# Patient Record
Sex: Male | Born: 1935
Health system: Southern US, Community
[De-identification: ages and names within clinical notes are randomized; demographics above are authoritative.]

## PROBLEM LIST (undated history)

## (undated) DIAGNOSIS — I10 Essential (primary) hypertension: Secondary | ICD-10-CM

## (undated) DIAGNOSIS — F329 Major depressive disorder, single episode, unspecified: Secondary | ICD-10-CM

## (undated) DIAGNOSIS — F411 Generalized anxiety disorder: Secondary | ICD-10-CM

## (undated) DIAGNOSIS — Z8546 Personal history of malignant neoplasm of prostate: Secondary | ICD-10-CM

## (undated) DIAGNOSIS — E785 Hyperlipidemia, unspecified: Secondary | ICD-10-CM

## (undated) DIAGNOSIS — N529 Male erectile dysfunction, unspecified: Secondary | ICD-10-CM

## (undated) DIAGNOSIS — J449 Chronic obstructive pulmonary disease, unspecified: Secondary | ICD-10-CM

## (undated) DIAGNOSIS — Z86010 Personal history of colonic polyps: Secondary | ICD-10-CM

## (undated) DIAGNOSIS — R5383 Other fatigue: Secondary | ICD-10-CM

## (undated) DIAGNOSIS — N400 Enlarged prostate without lower urinary tract symptoms: Secondary | ICD-10-CM

## (undated) DIAGNOSIS — G4700 Insomnia, unspecified: Secondary | ICD-10-CM

## (undated) DIAGNOSIS — R5381 Other malaise: Secondary | ICD-10-CM

## (undated) HISTORY — DX: Major depressive disorder, single episode, unspecified: F32.9

## (undated) HISTORY — DX: Benign prostatic hyperplasia without lower urinary tract symptoms: N40.0

## (undated) HISTORY — DX: Other fatigue: R53.83

## (undated) HISTORY — DX: Personal history of malignant neoplasm of prostate: Z85.46

## (undated) HISTORY — DX: Insomnia, unspecified: G47.00

## (undated) HISTORY — DX: Chronic obstructive pulmonary disease, unspecified: J44.9

## (undated) HISTORY — DX: Personal history of colonic polyps: Z86.010

## (undated) HISTORY — DX: Other malaise: R53.81

## (undated) HISTORY — DX: Essential (primary) hypertension: I10

## (undated) HISTORY — DX: Hyperlipidemia, unspecified: E78.5

## (undated) HISTORY — DX: Generalized anxiety disorder: F41.1

## (undated) HISTORY — DX: Male erectile dysfunction, unspecified: N52.9

---

## 2001-12-14 ENCOUNTER — Emergency Department (HOSPITAL_COMMUNITY): Admission: EM | Admit: 2001-12-14 | Discharge: 2001-12-14 | Payer: Self-pay | Admitting: Emergency Medicine

## 2001-12-24 ENCOUNTER — Emergency Department (HOSPITAL_COMMUNITY): Admission: EM | Admit: 2001-12-24 | Discharge: 2001-12-24 | Payer: Self-pay | Admitting: Emergency Medicine

## 2003-04-28 ENCOUNTER — Ambulatory Visit: Admission: RE | Admit: 2003-04-28 | Discharge: 2003-07-27 | Payer: Self-pay | Admitting: Radiation Oncology

## 2003-05-06 ENCOUNTER — Encounter: Admission: RE | Admit: 2003-05-06 | Discharge: 2003-05-06 | Payer: Self-pay | Admitting: Urology

## 2003-06-03 ENCOUNTER — Inpatient Hospital Stay (HOSPITAL_COMMUNITY): Admission: EM | Admit: 2003-06-03 | Discharge: 2003-06-11 | Payer: Self-pay | Admitting: Psychiatry

## 2003-08-13 ENCOUNTER — Ambulatory Visit: Admission: RE | Admit: 2003-08-13 | Discharge: 2003-10-15 | Payer: Self-pay | Admitting: Radiation Oncology

## 2003-08-24 ENCOUNTER — Ambulatory Visit (HOSPITAL_COMMUNITY): Admission: RE | Admit: 2003-08-24 | Discharge: 2003-08-24 | Payer: Self-pay | Admitting: Urology

## 2003-08-24 ENCOUNTER — Ambulatory Visit (HOSPITAL_BASED_OUTPATIENT_CLINIC_OR_DEPARTMENT_OTHER): Admission: RE | Admit: 2003-08-24 | Discharge: 2003-08-24 | Payer: Self-pay | Admitting: Urology

## 2004-07-31 ENCOUNTER — Ambulatory Visit: Payer: Self-pay | Admitting: Internal Medicine

## 2004-10-31 ENCOUNTER — Ambulatory Visit: Payer: Self-pay | Admitting: Internal Medicine

## 2004-11-06 ENCOUNTER — Ambulatory Visit (HOSPITAL_COMMUNITY): Admission: RE | Admit: 2004-11-06 | Discharge: 2004-11-06 | Payer: Self-pay | Admitting: Internal Medicine

## 2005-03-02 ENCOUNTER — Ambulatory Visit: Payer: Self-pay | Admitting: Internal Medicine

## 2006-07-05 ENCOUNTER — Ambulatory Visit: Payer: Self-pay | Admitting: Internal Medicine

## 2006-07-05 LAB — CONVERTED CEMR LAB
ALT: 21 units/L (ref 0–40)
AST: 20 units/L (ref 0–37)
Albumin: 3.6 g/dL (ref 3.5–5.2)
Alkaline Phosphatase: 76 units/L (ref 39–117)
BUN: 8 mg/dL (ref 6–23)
Basophils Absolute: 0 10*3/uL (ref 0.0–0.1)
Basophils Relative: 0 % (ref 0.0–1.0)
Bilirubin, Direct: 0.1 mg/dL (ref 0.0–0.3)
CO2: 26 meq/L (ref 19–32)
Calcium: 9.1 mg/dL (ref 8.4–10.5)
Chloride: 101 meq/L (ref 96–112)
Creatinine, Ser: 1.2 mg/dL (ref 0.4–1.5)
Eosinophils Absolute: 0.1 10*3/uL (ref 0.0–0.6)
Eosinophils Relative: 0.8 % (ref 0.0–5.0)
GFR calc Af Amer: 77 mL/min
GFR calc non Af Amer: 64 mL/min
Glucose, Bld: 100 mg/dL — ABNORMAL HIGH (ref 70–99)
HCT: 51.4 % (ref 39.0–52.0)
Hemoglobin: 17.7 g/dL — ABNORMAL HIGH (ref 13.0–17.0)
Lymphocytes Relative: 18.1 % (ref 12.0–46.0)
MCHC: 34.4 g/dL (ref 30.0–36.0)
MCV: 93.5 fL (ref 78.0–100.0)
Monocytes Absolute: 0.8 10*3/uL — ABNORMAL HIGH (ref 0.2–0.7)
Monocytes Relative: 8.6 % (ref 3.0–11.0)
Neutro Abs: 6.3 10*3/uL (ref 1.4–7.7)
Neutrophils Relative %: 72.5 % (ref 43.0–77.0)
Platelets: 218 10*3/uL (ref 150–400)
Potassium: 4.9 meq/L (ref 3.5–5.1)
RBC: 5.5 M/uL (ref 4.22–5.81)
RDW: 12.5 % (ref 11.5–14.6)
Sodium: 137 meq/L (ref 135–145)
TSH: 2.1 microintl units/mL (ref 0.35–5.50)
Total Bilirubin: 1.2 mg/dL (ref 0.3–1.2)
Total Protein: 6.8 g/dL (ref 6.0–8.3)
WBC: 8.8 10*3/uL (ref 4.5–10.5)

## 2006-10-17 ENCOUNTER — Ambulatory Visit (HOSPITAL_COMMUNITY): Admission: RE | Admit: 2006-10-17 | Discharge: 2006-10-17 | Payer: Self-pay | Admitting: Urology

## 2007-12-22 ENCOUNTER — Ambulatory Visit: Payer: Self-pay | Admitting: Internal Medicine

## 2007-12-22 DIAGNOSIS — E785 Hyperlipidemia, unspecified: Secondary | ICD-10-CM

## 2007-12-22 DIAGNOSIS — F329 Major depressive disorder, single episode, unspecified: Secondary | ICD-10-CM

## 2007-12-22 DIAGNOSIS — G4700 Insomnia, unspecified: Secondary | ICD-10-CM

## 2007-12-22 DIAGNOSIS — F411 Generalized anxiety disorder: Secondary | ICD-10-CM

## 2007-12-22 DIAGNOSIS — N400 Enlarged prostate without lower urinary tract symptoms: Secondary | ICD-10-CM

## 2007-12-22 DIAGNOSIS — Z8546 Personal history of malignant neoplasm of prostate: Secondary | ICD-10-CM

## 2007-12-22 DIAGNOSIS — J449 Chronic obstructive pulmonary disease, unspecified: Secondary | ICD-10-CM

## 2007-12-22 DIAGNOSIS — R5383 Other fatigue: Secondary | ICD-10-CM

## 2007-12-22 DIAGNOSIS — Z860100 Personal history of colon polyps, unspecified: Secondary | ICD-10-CM | POA: Insufficient documentation

## 2007-12-22 DIAGNOSIS — R5381 Other malaise: Secondary | ICD-10-CM

## 2007-12-22 DIAGNOSIS — I10 Essential (primary) hypertension: Secondary | ICD-10-CM

## 2007-12-22 HISTORY — DX: Benign prostatic hyperplasia without lower urinary tract symptoms: N40.0

## 2007-12-22 HISTORY — DX: Hyperlipidemia, unspecified: E78.5

## 2007-12-22 HISTORY — DX: Major depressive disorder, single episode, unspecified: F32.9

## 2007-12-22 HISTORY — DX: Chronic obstructive pulmonary disease, unspecified: J44.9

## 2007-12-22 HISTORY — DX: Insomnia, unspecified: G47.00

## 2007-12-22 HISTORY — DX: Essential (primary) hypertension: I10

## 2007-12-22 HISTORY — DX: Personal history of malignant neoplasm of prostate: Z85.46

## 2007-12-22 HISTORY — DX: Personal history of colonic polyps: Z86.010

## 2007-12-22 HISTORY — DX: Generalized anxiety disorder: F41.1

## 2007-12-22 HISTORY — DX: Other malaise: R53.81

## 2007-12-22 LAB — CONVERTED CEMR LAB
ALT: 13 units/L (ref 0–53)
AST: 16 units/L (ref 0–37)
Albumin: 3.6 g/dL (ref 3.5–5.2)
Alkaline Phosphatase: 75 units/L (ref 39–117)
BUN: 13 mg/dL (ref 6–23)
Basophils Absolute: 0 10*3/uL (ref 0.0–0.1)
Basophils Relative: 0 % (ref 0.0–3.0)
Bilirubin, Direct: 0.1 mg/dL (ref 0.0–0.3)
CO2: 29 meq/L (ref 19–32)
Calcium: 8.9 mg/dL (ref 8.4–10.5)
Chloride: 106 meq/L (ref 96–112)
Cholesterol: 201 mg/dL (ref 0–200)
Creatinine, Ser: 1.3 mg/dL (ref 0.4–1.5)
Direct LDL: 118.1 mg/dL
Eosinophils Absolute: 0.1 10*3/uL (ref 0.0–0.7)
Eosinophils Relative: 0.6 % (ref 0.0–5.0)
GFR calc Af Amer: 70 mL/min
GFR calc non Af Amer: 58 mL/min
Glucose, Bld: 96 mg/dL (ref 70–99)
HCT: 46.1 % (ref 39.0–52.0)
HDL: 52.2 mg/dL (ref >39.0)
Hemoglobin: 15.4 g/dL (ref 13.0–17.0)
Lymphocytes Relative: 18.9 % (ref 12.0–46.0)
MCHC: 33.5 g/dL (ref 30.0–36.0)
MCV: 95.5 fL (ref 78.0–100.0)
Monocytes Absolute: 0.7 10*3/uL (ref 0.1–1.0)
Monocytes Relative: 8.4 % (ref 3.0–12.0)
Neutro Abs: 6 10*3/uL (ref 1.4–7.7)
Neutrophils Relative %: 72.1 % (ref 43.0–77.0)
Platelets: 232 10*3/uL (ref 150–400)
Potassium: 4.8 meq/L (ref 3.5–5.1)
RBC: 4.83 M/uL (ref 4.22–5.81)
RDW: 13.2 % (ref 11.5–14.6)
Sodium: 141 meq/L (ref 135–145)
TSH: 1.84 microintl units/mL (ref 0.35–5.50)
Total Bilirubin: 0.6 mg/dL (ref 0.3–1.2)
Total CHOL/HDL Ratio: 3.9
Total Protein: 6.8 g/dL (ref 6.0–8.3)
Triglycerides: 113 mg/dL (ref 0–149)
VLDL: 23 mg/dL (ref 0–40)
WBC: 8.4 10*3/uL (ref 4.5–10.5)

## 2007-12-24 LAB — CONVERTED CEMR LAB: Vit D, 1,25-Dihydroxy: 20 — ABNORMAL LOW (ref 30–89)

## 2008-01-22 ENCOUNTER — Ambulatory Visit: Payer: Self-pay | Admitting: Internal Medicine

## 2008-02-05 ENCOUNTER — Ambulatory Visit: Payer: Self-pay | Admitting: Internal Medicine

## 2008-02-05 ENCOUNTER — Encounter: Payer: Self-pay | Admitting: Internal Medicine

## 2008-02-09 ENCOUNTER — Encounter: Payer: Self-pay | Admitting: Internal Medicine

## 2008-03-02 ENCOUNTER — Ambulatory Visit: Payer: Self-pay | Admitting: Internal Medicine

## 2008-04-06 ENCOUNTER — Telehealth (INDEPENDENT_AMBULATORY_CARE_PROVIDER_SITE_OTHER): Payer: Self-pay | Admitting: *Deleted

## 2008-06-25 ENCOUNTER — Ambulatory Visit: Payer: Self-pay | Admitting: Internal Medicine

## 2008-06-25 ENCOUNTER — Telehealth (INDEPENDENT_AMBULATORY_CARE_PROVIDER_SITE_OTHER): Payer: Self-pay | Admitting: *Deleted

## 2008-09-22 ENCOUNTER — Telehealth (INDEPENDENT_AMBULATORY_CARE_PROVIDER_SITE_OTHER): Payer: Self-pay | Admitting: *Deleted

## 2008-12-07 ENCOUNTER — Telehealth: Payer: Self-pay | Admitting: Internal Medicine

## 2009-02-08 ENCOUNTER — Telehealth: Payer: Self-pay | Admitting: Internal Medicine

## 2009-02-21 ENCOUNTER — Telehealth: Payer: Self-pay | Admitting: Internal Medicine

## 2009-02-25 ENCOUNTER — Ambulatory Visit: Payer: Self-pay | Admitting: Internal Medicine

## 2009-02-25 LAB — CONVERTED CEMR LAB
ALT: 12 units/L (ref 0–53)
AST: 19 units/L (ref 0–37)
Albumin: 3.8 g/dL (ref 3.5–5.2)
Alkaline Phosphatase: 81 units/L (ref 39–117)
BUN: 16 mg/dL (ref 6–23)
Basophils Absolute: 0 10*3/uL (ref 0.0–0.1)
Basophils Relative: 0.4 % (ref 0.0–3.0)
Bilirubin, Direct: 0.1 mg/dL (ref 0.0–0.3)
CO2: 27 meq/L (ref 19–32)
Calcium: 8.7 mg/dL (ref 8.4–10.5)
Chloride: 104 meq/L (ref 96–112)
Cholesterol: 202 mg/dL — ABNORMAL HIGH (ref 0–200)
Creatinine, Ser: 1.3 mg/dL (ref 0.4–1.5)
Direct LDL: 132.3 mg/dL
Eosinophils Absolute: 0.1 10*3/uL (ref 0.0–0.7)
Eosinophils Relative: 1.2 % (ref 0.0–5.0)
GFR calc non Af Amer: 57.52 mL/min (ref >60)
Glucose, Bld: 115 mg/dL — ABNORMAL HIGH (ref 70–99)
HCT: 46.9 % (ref 39.0–52.0)
HDL: 60.5 mg/dL (ref >39.00)
Hemoglobin: 16.1 g/dL (ref 13.0–17.0)
Leukocytes, UA: NEGATIVE
Lymphocytes Relative: 16.7 % (ref 12.0–46.0)
Lymphs Abs: 1.3 10*3/uL (ref 0.7–4.0)
MCHC: 34.4 g/dL (ref 30.0–36.0)
MCV: 96.4 fL (ref 78.0–100.0)
Monocytes Absolute: 0.7 10*3/uL (ref 0.1–1.0)
Monocytes Relative: 8.9 % (ref 3.0–12.0)
Neutro Abs: 5.6 10*3/uL (ref 1.4–7.7)
Neutrophils Relative %: 72.8 % (ref 43.0–77.0)
Nitrite: NEGATIVE
Platelets: 231 10*3/uL (ref 150.0–400.0)
Potassium: 5.2 meq/L — ABNORMAL HIGH (ref 3.5–5.1)
RBC: 4.87 M/uL (ref 4.22–5.81)
RDW: 13.7 % (ref 11.5–14.6)
Sodium: 135 meq/L (ref 135–145)
Specific Gravity, Urine: 1.025 (ref 1.000–1.030)
TSH: 1.84 microintl units/mL (ref 0.35–5.50)
Total Bilirubin: 0.8 mg/dL (ref 0.3–1.2)
Total CHOL/HDL Ratio: 3
Total Protein, Urine: NEGATIVE mg/dL
Total Protein: 7.1 g/dL (ref 6.0–8.3)
Triglycerides: 74 mg/dL (ref 0.0–149.0)
Urine Glucose: NEGATIVE mg/dL
Urobilinogen, UA: 1 (ref 0.0–1.0)
VLDL: 14.8 mg/dL (ref 0.0–40.0)
WBC: 7.7 10*3/uL (ref 4.5–10.5)
pH: 5.5 (ref 5.0–8.0)

## 2009-03-07 ENCOUNTER — Telehealth: Payer: Self-pay | Admitting: Internal Medicine

## 2009-03-11 ENCOUNTER — Ambulatory Visit: Payer: Self-pay | Admitting: Internal Medicine

## 2009-04-12 ENCOUNTER — Ambulatory Visit: Payer: Self-pay | Admitting: Pulmonary Disease

## 2009-04-12 ENCOUNTER — Encounter: Payer: Self-pay | Admitting: Internal Medicine

## 2009-06-12 ENCOUNTER — Encounter: Payer: Self-pay | Admitting: Emergency Medicine

## 2009-06-12 ENCOUNTER — Inpatient Hospital Stay (HOSPITAL_COMMUNITY): Admission: AD | Admit: 2009-06-12 | Discharge: 2009-06-18 | Payer: Self-pay | Admitting: Orthopedic Surgery

## 2009-06-14 ENCOUNTER — Encounter (INDEPENDENT_AMBULATORY_CARE_PROVIDER_SITE_OTHER): Payer: Self-pay | Admitting: Orthopedic Surgery

## 2010-02-20 ENCOUNTER — Ambulatory Visit: Payer: Self-pay | Admitting: Internal Medicine

## 2010-02-20 LAB — CONVERTED CEMR LAB
ALT: 13 units/L (ref 0–53)
AST: 20 units/L (ref 0–37)
Albumin: 3.8 g/dL (ref 3.5–5.2)
Alkaline Phosphatase: 65 units/L (ref 39–117)
BUN: 18 mg/dL (ref 6–23)
Basophils Absolute: 0.1 10*3/uL (ref 0.0–0.1)
Basophils Relative: 0.9 % (ref 0.0–3.0)
Bilirubin, Direct: 0.1 mg/dL (ref 0.0–0.3)
CO2: 29 meq/L (ref 19–32)
Calcium: 9 mg/dL (ref 8.4–10.5)
Chloride: 104 meq/L (ref 96–112)
Cholesterol: 204 mg/dL — ABNORMAL HIGH (ref 0–200)
Creatinine, Ser: 1.1 mg/dL (ref 0.4–1.5)
Direct LDL: 108.9 mg/dL
Eosinophils Absolute: 0.1 10*3/uL (ref 0.0–0.7)
Eosinophils Relative: 1.5 % (ref 0.0–5.0)
GFR calc non Af Amer: 68.13 mL/min (ref >60)
Glucose, Bld: 91 mg/dL (ref 70–99)
HCT: 47.3 % (ref 39.0–52.0)
HDL: 65.8 mg/dL (ref >39.00)
Hemoglobin, Urine: NEGATIVE
Hemoglobin: 16.2 g/dL (ref 13.0–17.0)
Leukocytes, UA: NEGATIVE
Lymphocytes Relative: 24.8 % (ref 12.0–46.0)
Lymphs Abs: 1.9 10*3/uL (ref 0.7–4.0)
MCHC: 34.2 g/dL (ref 30.0–36.0)
MCV: 97.8 fL (ref 78.0–100.0)
Monocytes Absolute: 0.5 10*3/uL (ref 0.1–1.0)
Monocytes Relative: 6.8 % (ref 3.0–12.0)
Neutro Abs: 5 10*3/uL (ref 1.4–7.7)
Neutrophils Relative %: 66 % (ref 43.0–77.0)
Nitrite: NEGATIVE
PSA: 0.3 ng/mL (ref 0.10–4.00)
Platelets: 211 10*3/uL (ref 150.0–400.0)
Potassium: 4.9 meq/L (ref 3.5–5.1)
RBC: 4.84 M/uL (ref 4.22–5.81)
RDW: 13.7 % (ref 11.5–14.6)
Sodium: 139 meq/L (ref 135–145)
Specific Gravity, Urine: >=1.03 (ref 1.000–1.030)
TSH: 1.38 microintl units/mL (ref 0.35–5.50)
Total Bilirubin: 0.6 mg/dL (ref 0.3–1.2)
Total CHOL/HDL Ratio: 3
Total Protein, Urine: NEGATIVE mg/dL
Total Protein: 6.5 g/dL (ref 6.0–8.3)
Triglycerides: 63 mg/dL (ref 0.0–149.0)
Urine Glucose: NEGATIVE mg/dL
Urobilinogen, UA: 0.2 (ref 0.0–1.0)
VLDL: 12.6 mg/dL (ref 0.0–40.0)
WBC: 7.6 10*3/uL (ref 4.5–10.5)
pH: 5 (ref 5.0–8.0)

## 2010-04-10 ENCOUNTER — Telehealth: Payer: Self-pay | Admitting: Internal Medicine

## 2010-04-17 ENCOUNTER — Ambulatory Visit: Payer: Self-pay | Admitting: Pulmonary Disease

## 2010-05-04 ENCOUNTER — Telehealth: Payer: Self-pay | Admitting: Internal Medicine

## 2010-06-27 NOTE — Progress Notes (Signed)
Summary: Rx req for sleep  Phone Note Call from Patient Call back at Home Phone [phone]   Caller: Patient Summary of Call: Pt called requesting Rx for Lunesta for sleep. Pt stated he has tried [place] nd Trazadone with no help, please advise.  [facility] Initial call taken by: Margaret Pyle, CMA,  May 04, 2010 4:59 PM  Follow-up for Phone Call        ok - done hardcopy to LIM side B - dahlia  Follow-up by: Corwin Levins MD,  May 04, 2010 5:03 PM  Additional Follow-up for Phone Call Additional follow up Details #1::        Rx faxed to pharmacy Additional Follow-up by: Margaret Pyle, CMA,  May 05, 2010 8:19 AM    New/Updated Medications: LUNESTA 3 MG TABS (ESZOPICLONE) 1 by mouth at bedtime as needed Prescriptions: LUNESTA 3 MG TABS (ESZOPICLONE) 1 by mouth at bedtime as needed  #30 x 5   Entered and Authorized by:   Corwin Levins MD   Signed by:   Corwin Levins MD on 05/04/2010   Method used:   Print then Give to Patient   RxID:   [id number]

## 2010-06-27 NOTE — Assessment & Plan Note (Signed)
Summary: rov for insomnia   Primary Tim Foster/Referring Della Homan:  Dr. Oliver Barre  CC:  f/u. Pt c/o still having problems going to sleep. d/c sleep medication due to it not helping.  History of Present Illness: the pt comes in today for f/u of his insomnia.  He has lifelong insomnia and was evaluated one year ago for this.  The etiology was felt to be multifactorial, including sleep hygiene, alcohol, depression, and anxiety.  I reviewed sleep hygiene practices with him, and also went over stimulus control therapy.  I tried trazodone for 2 weeks, but the pt states that it did not help.  He comes in today with basically the same issues.  He really did not stick with the behavioral therapies I recommended.  Preventive Screening-Counseling & Management  Alcohol-Tobacco     Alcohol drinks/day: 3     Alcohol type: beer     Smoking Status: current     Packs/Day: <0.25     Pack years: 50 yrs x2 ppd  Current Medications (verified): 1)  None  Allergies (verified): No Known Drug Allergies  Social History: Smoking Status:  current Packs/Day:  <0.25  Review of Systems       The patient complains of productive cough, non-productive cough, and sneezing.  The patient denies shortness of breath with activity, shortness of breath at rest, coughing up blood, chest pain, irregular heartbeats, acid heartburn, indigestion, loss of appetite, weight change, abdominal pain, difficulty swallowing, sore throat, tooth/dental problems, headaches, nasal congestion/difficulty breathing through nose, itching, ear ache, anxiety, depression, hand/feet swelling, joint stiffness or pain, rash, change in color of mucus, and fever.    Vital Signs:  Patient profile:   77 year old male Height:      68 inches Weight:      137 pounds O2 Sat:      97 % on Room air Temp:     97.7 degrees F oral Pulse rate:   124 / minute BP sitting:   100 / 70  (left arm) Cuff size:   regular  Vitals Entered By: Carver Fila (April 17, 2010 2:01 PM)  O2 Flow:  Room air CC: f/u. Pt c/o still having problems going to sleep. d/c sleep medication due to it not helping Comments meds and allergies updated Phone number updated Carver Fila  April 17, 2010 2:02 PM    Physical Exam  General:  thin male in nad Extremities:  no edema or cyanosis  Neurologic:  appears mild sleepy, oriented,  moves all 4.   Impression & Recommendations:  Problem # 1:  PERSISTENT DISORDER INITIATING/MAINTAINING SLEEP (ICD-307.42)  the pt continues to have major issues with his sleep that are deep-seeded.  I have reviewed sleep hygiene with him again, as well as stimulus control therapy, and this is the best that I can do at this point.  I would highly recommend referral to behavioral specialist to work on some of his issues and possibly cognitive behavioral therapy.  Other Orders: Est. Patient Level III (57846)  Patient Instructions: 1)  do not go to bed until 12mn.  If you cannot go to sleep within , need to go to family room and watch tv or read until you are able to go back to sleep.  The goal is to not stay in bed any more than without actually sleeping.  Do this as many times as it takes until you fall asleep. 2)  Get up by 7am each day to take advantage of  morning sunlight, no matter how many hours you slept the night before.   3)  no napping during day ,and no caffeine after 10am. 4)  try and limit alcohol intake to one beverage a day. 5)  I am going to recommend to Dr. Jonny Ruiz to consider a referral to a behavioral therapist to help with this problem.   Immunization History:  Pneumovax Immunization History:    Pneumovax:  historical (09/29/2008)

## 2010-06-27 NOTE — Assessment & Plan Note (Signed)
Summary: SLEEP ISSUES/NWS  #   Vital Signs:  Patient profile:   77 year old male Height:      68 inches Weight:      139.50 pounds BMI:     21.29 O2 Sat:      99 % on Room air Temp:     97.3 degrees F oral Pulse rate:   100 / minute BP sitting:   102 / 80  (left arm) Cuff size:   regular  Vitals Entered By: Zella Ball Ewing CMA Duncan Dull) (February 20, 2010 2:20 PM)  O2 Flow:  Room air  Preventive Care Screening  Colonoscopy:    Date:  02/05/2008    Next Due:  01/2013    Results:  abnormal   CC: Sleep problems/RE   Primary Care Provider:  Dr. Oliver Barre  CC:  Sleep problems/RE.  History of Present Illness: here for wellness with with chronic insomnia for approx 10 mo;  not taking recent meds b/c did not seem to work;  Pt denies CP, worsening sob, doe, wheezing, orthopnea, pnd, worsening LE edema, palps, dizziness or syncope  Pt denies new neuro symptoms such as headache, facial or extremity weakness Pt denies polydipsia, polyuria  Overall good compliance with meds, trying to follow low chol, DM diet, wt stable, little excercise however  Denies worsening depressive symptoms or anxiety or panic,  No suicidal ideation  Preventive Screening-Counseling & Management      Drug Use:  no.    Problems Prior to Update: 1)  Persistent Disorder Initiating/maintaining Sleep  (ICD-307.42) 2)  Preventive Health Care  (ICD-V70.0) 3)  Fatigue  (ICD-780.79) 4)  COPD  (ICD-496) 5)  Benign Prostatic Hypertrophy  (ICD-600.00) 6)  Insomnia, Chronic  (ICD-307.42) 7)  Anxiety  (ICD-300.00) 8)  Depression  (ICD-311) 9)  Prostate Cancer, Hx of  (ICD-V10.46) 10)  Colonic Polyps, Hx of  (ICD-V12.72) 11)  Hypertension  (ICD-401.9) 12)  Hyperlipidemia  (ICD-272.4)  Medications Prior to Update: 1)  Mirtazapine 15 Mg Tabs (Mirtazapine) .Marland Kitchen.. 1po At Bedtime 2)  Vitamin D3 1000 Unit  Tabs (Cholecalciferol) .Marland Kitchen.. 1 By Mouth Once Daily 3)  Trazodone Hcl 100 Mg Tabs (Trazodone Hcl) .Marland Kitchen.. 1 By Mouth At  Bedtime 4)  Trazodone Hcl 50 Mg  Tabs (Trazodone Hcl) .... At Bedtime  Current Medications (verified): 1)  Vitamin D3 1000 Unit  Tabs (Cholecalciferol) .Marland Kitchen.. 1 By Mouth Once Daily 2)  Aspir-Low 81 Mg Tbec (Aspirin) .Marland Kitchen.. 1po Once Daily 3)  Silenor 6 Mg Tabs (Doxepin Hcl) .Marland Kitchen.. 1po At Bedtime As Needed Sleep  Allergies (verified): No Known Drug Allergies  Past History:  Past Surgical History: Last updated: 12/22/2007 Denies surgical history  Family History: Last updated: 12/22/2007 brother with depression, heart disease  Social History: Last updated: 02/20/2010 Married Current Smoker Alcohol use-yes former truck Hospital doctor Drug use-no  Risk Factors: Alcohol Use: 3 (04/12/2009)  Risk Factors: Smoking Status: quit (04/12/2009)  Past Medical History: Hyperlipidemia Hypertension hx of ETOH dependence Colonic polyps, hx of Prostate cancer, hx of - dr Aldean Ast, s/p XRT 12/04 Depression - hospitalized 1/05 Anxiety insomnia - chronic Benign prostatic hypertrophy known bilat inguinal hernias - declined surgury E.D.  hx of gambling addiction COPD  Social History: Reviewed history from 12/22/2007 and no changes required. 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         all otherwise negative per pt -    Physical Exam  General:  alert and underweight appearing.  , chronic ill appearing, mild disheveled, looks older than stated age Head:  normocephalic  and atraumatic.   Eyes:  vision grossly intact, pupils equal, and pupils round.   Ears:  R ear normal and L ear normal.   Nose:  no external deformity and no nasal discharge.   Mouth:  no gingival abnormalities and pharynx pink and moist.   Neck:  supple and no masses.   Lungs:  normal respiratory effort and normal breath sounds.   Heart:  normal rate and regular rhythm.   Abdomen:  soft, non-tender, and normal bowel sounds.   Msk:  no joint tenderness and no joint swelling.   Extremities:  no edema, no erythema  Neurologic:  cranial nerves II-XII intact and strength normal in all extremities.   Skin:  color normal and no rashes.   Psych:  not depressed appearing and slightly anxious.     Impression & Recommendations:  Problem # 1:  Preventive Health Care (ICD-V70.0)  Overall doing well, age appropriate education and counseling updated and referral for appropriate preventive services done unless declined, immunizations up to date or declined, diet counseling done if overweight, urged to quit smoking if smokes , most recent labs reviewed and current ordered if appropriate, ecg reviewed or declined (interpretation per ECG scanned in the EMR if done); information regarding Medicare Prevention requirements given if appropriate; speciality referrals updated as appropriate   Orders: TLB-BMP (Basic Metabolic Panel-BMET) (80048-METABOL) TLB-CBC Platelet - w/Differential (85025-CBCD) TLB-Hepatic/Liver Function Pnl (80076-HEPATIC) TLB-Lipid Panel (80061-LIPID) TLB-TSH (Thyroid Stimulating Hormone) (84443-TSH) TLB-Udip ONLY (81003-UDIP)  Problem # 2:  INSOMNIA, CHRONIC (ICD-307.42) treat as above, f/u any worsening signs or symptoms  - try the silenor - gave samples as well   Problem # 3:  PROSTATE CANCER, HX OF (ICD-V10.46)  pt requests PSA re-eval; I urged also to return to see Dr Aldean Ast  Orders: TLB-PSA (Prostate Specific Antigen) (84153-PSA)  Complete Medication List: 1)  Vitamin  D3 1000 Unit Tabs (Cholecalciferol) .Marland Kitchen.. 1 by mouth once daily 2)  Aspir-low 81 Mg Tbec (Aspirin) .Marland Kitchen.. 1po once daily 3)  Silenor 6 Mg Tabs (Doxepin hcl) .Marland Kitchen.. 1po at bedtime as needed sleep  Other Orders: Flu Vaccine 61yrs + MEDICARE PATIENTS (Y7829) Administration Flu vaccine - MCR (F6213)  Patient Instructions: 1)  you had the flu shot today 2)  Please go to the Lab in the basement for your blood and/or urine tests today 3)  Please take all new medications as prescribed 4)  Please call the number on the [facility] Card for results of your testing  5)  Please schedule a follow-up appointment in 1 year or sooner if needed Prescriptions: SILENOR 6 MG TABS (DOXEPIN HCL) 1po at bedtime as needed sleep  #30 x 11   Entered and Authorized by:   Corwin Levins MD   Signed by:   Corwin Levins MD on 02/20/2010   Method used:   Print then Give to Patient   RxID:   [id number]   Flu Vaccine Consent Questions     Do you have a history of severe allergic reactions to this vaccine? no    Any prior history  of allergic reactions to egg and/or gelatin? no    Do you have a sensitivity to the preservative Thimersol? no    Do you have a past history of Guillan-Barre Syndrome? no    Do you currently have an acute febrile illness? no    Have you ever had a severe reaction to latex? no    Vaccine information given and explained to patient? yes    Are you currently pregnant? no    Lot Number:[id number]   Exp Date:11/25/2010   Site Given  Left Deltoid IMdflu

## 2010-06-27 NOTE — Progress Notes (Signed)
Summary: Referral  Phone Note Call from Patient Call back at Home Phone [phone]   Caller: Patient Summary of Call: Pt called requesting referral to [facility] fro Insomnia. Pt aware that he will be contacted by [facility] once order is placed. Initial call taken by: Margaret Pyle, CMA,  April 10, 2010 1:38 PM  Follow-up for Phone Call        ok - will do Follow-up by: Corwin Levins MD,  April 10, 2010 2:12 PM

## 2010-08-13 LAB — BASIC METABOLIC PANEL
BUN: 12 mg/dL (ref 6–23)
BUN: 12 mg/dL (ref 6–23)
BUN: 13 mg/dL (ref 6–23)
BUN: 17 mg/dL (ref 6–23)
CO2: 22 mEq/L (ref 19–32)
CO2: 22 mEq/L (ref 19–32)
CO2: 24 mEq/L (ref 19–32)
CO2: 24 mEq/L (ref 19–32)
Calcium: 7.5 mg/dL — ABNORMAL LOW (ref 8.4–10.5)
Calcium: 7.7 mg/dL — ABNORMAL LOW (ref 8.4–10.5)
Calcium: 7.8 mg/dL — ABNORMAL LOW (ref 8.4–10.5)
Calcium: 8.3 mg/dL — ABNORMAL LOW (ref 8.4–10.5)
Chloride: 104 mEq/L (ref 96–112)
Chloride: 105 mEq/L (ref 96–112)
Chloride: 105 mEq/L (ref 96–112)
Chloride: 106 mEq/L (ref 96–112)
Creatinine, Ser: 1.06 mg/dL (ref 0.4–1.5)
Creatinine, Ser: 1.09 mg/dL (ref 0.4–1.5)
Creatinine, Ser: 1.14 mg/dL (ref 0.4–1.5)
Creatinine, Ser: 1.26 mg/dL (ref 0.4–1.5)
GFR calc Af Amer: >60 mL/min (ref >60)
GFR calc Af Amer: >60 mL/min (ref >60)
GFR calc Af Amer: >60 mL/min (ref >60)
GFR calc Af Amer: >60 mL/min (ref >60)
GFR calc non Af Amer: 56 mL/min — ABNORMAL LOW (ref >60)
GFR calc non Af Amer: >60 mL/min (ref >60)
GFR calc non Af Amer: >60 mL/min (ref >60)
GFR calc non Af Amer: >60 mL/min (ref >60)
Glucose, Bld: 100 mg/dL — ABNORMAL HIGH (ref 70–99)
Glucose, Bld: 112 mg/dL — ABNORMAL HIGH (ref 70–99)
Glucose, Bld: 155 mg/dL — ABNORMAL HIGH (ref 70–99)
Glucose, Bld: 173 mg/dL — ABNORMAL HIGH (ref 70–99)
Potassium: 3.8 mEq/L (ref 3.5–5.1)
Potassium: 4 mEq/L (ref 3.5–5.1)
Potassium: 4.1 mEq/L (ref 3.5–5.1)
Potassium: 4.5 mEq/L (ref 3.5–5.1)
Sodium: 131 mEq/L — ABNORMAL LOW (ref 135–145)
Sodium: 132 mEq/L — ABNORMAL LOW (ref 135–145)
Sodium: 134 mEq/L — ABNORMAL LOW (ref 135–145)
Sodium: 134 mEq/L — ABNORMAL LOW (ref 135–145)

## 2010-08-13 LAB — CBC
HCT: 27.9 % — ABNORMAL LOW (ref 39.0–52.0)
HCT: 31.9 % — ABNORMAL LOW (ref 39.0–52.0)
HCT: 35 % — ABNORMAL LOW (ref 39.0–52.0)
HCT: 40.4 % (ref 39.0–52.0)
Hemoglobin: 10.8 g/dL — ABNORMAL LOW (ref 13.0–17.0)
Hemoglobin: 11.8 g/dL — ABNORMAL LOW (ref 13.0–17.0)
Hemoglobin: 13.6 g/dL (ref 13.0–17.0)
Hemoglobin: 9.6 g/dL — ABNORMAL LOW (ref 13.0–17.0)
MCHC: 33.6 g/dL (ref 30.0–36.0)
MCHC: 33.7 g/dL (ref 30.0–36.0)
MCHC: 33.9 g/dL (ref 30.0–36.0)
MCHC: 34.2 g/dL (ref 30.0–36.0)
MCV: 94.4 fL (ref 78.0–100.0)
MCV: 95 fL (ref 78.0–100.0)
MCV: 95.2 fL (ref 78.0–100.0)
MCV: 95.3 fL (ref 78.0–100.0)
Platelets: 176 10*3/uL (ref 150–400)
Platelets: 180 10*3/uL (ref 150–400)
Platelets: 189 10*3/uL (ref 150–400)
Platelets: 215 10*3/uL (ref 150–400)
RBC: 2.94 MIL/uL — ABNORMAL LOW (ref 4.22–5.81)
RBC: 3.34 MIL/uL — ABNORMAL LOW (ref 4.22–5.81)
RBC: 3.68 MIL/uL — ABNORMAL LOW (ref 4.22–5.81)
RBC: 4.28 MIL/uL (ref 4.22–5.81)
RDW: 13.4 % (ref 11.5–15.5)
RDW: 13.5 % (ref 11.5–15.5)
RDW: 13.6 % (ref 11.5–15.5)
RDW: 13.6 % (ref 11.5–15.5)
WBC: 10.4 10*3/uL (ref 4.0–10.5)
WBC: 13.2 10*3/uL — ABNORMAL HIGH (ref 4.0–10.5)
WBC: 9.6 10*3/uL (ref 4.0–10.5)
WBC: 9.7 10*3/uL (ref 4.0–10.5)

## 2010-08-13 LAB — CROSSMATCH
ABO/RH(D): A NEG
Antibody Screen: NEGATIVE

## 2010-08-13 LAB — CARDIAC PANEL(CRET KIN+CKTOT+MB+TROPI)
CK, MB: 1.2 ng/mL (ref 0.3–4.0)
Relative Index: 0.6 (ref 0.0–2.5)
Total CK: 197 U/L (ref 7–232)
Troponin I: 0.02 ng/mL (ref 0.00–0.06)

## 2010-08-13 LAB — PROTIME-INR
INR: 1.05 (ref 0.00–1.49)
Prothrombin Time: 13.6 seconds (ref 11.6–15.2)

## 2010-08-13 LAB — HEMOGLOBIN A1C
Hgb A1c MFr Bld: 5.5 % (ref 4.6–6.1)
Mean Plasma Glucose: 111 mg/dL

## 2010-08-13 LAB — ABO/RH: ABO/RH(D): A NEG

## 2010-08-13 LAB — APTT: aPTT: 31 seconds (ref 24–37)

## 2010-10-13 NOTE — Discharge Summary (Signed)
NAME:  Tim Foster, Tim Foster NO.:  [id number]   MEDICAL RECORD NO.:  [id number]                   PATIENT TYPE:  IPS   LOCATION:  0508                                 FACILITY:  [facility]   PHYSICIAN:  Geoffery Lyons, M.D.                   DATE OF BIRTH:  01/29/1934   DATE OF ADMISSION:  06/03/2003  DATE OF DISCHARGE:  06/11/2003                                 DISCHARGE SUMMARY   CHIEF COMPLAINT AND PRESENT ILLNESS:  This was the first admission to [facility] for this 77 year old white male voluntarily admitted.  History of alcohol abuse.  Decided to quit drinking four days prior to this  admission.  He was worried that his drinking would interfere with his cancer  medication that he was taking for a two-month-old diagnosis of prostate  cancer.  Admitted to anxiety, depressed mood, obsessive worrying, especially  after he quit his second job.  Worse since he was diagnosed with prostatic  cancer two months prior to this admission.  Suicidal ideation for two weeks,  ruminating, drinking 8-10 beers for at least 40-45 years.  Since he took his  last drink, he has been shaky, anxious and unable to sleep.   PAST PSYCHIATRIC HISTORY:  First time inpatient.  Was treated for depression  by his primary physician.   ALCOHOL/DRUG HISTORY:  Persistent use of alcohol as already stated.   PAST MEDICAL HISTORY:  Prostatic cancer, right knee surgery, seizure,  possibly withdrawal.   PHYSICAL EXAMINATION:  Performed and failed to show any acute findings.   MENTAL STATUS EXAM:  Fully alert male.  Anxious affect.  Tremulous but  cooperative and pleasant.  Blunted affect.  Speech was normal in amount,  pace and tone.  Mood was depressed, helpless and anxious.  Thought processes  were logical and coherent, positive for suicidal ideation, no clear plan.  No homicidal ideation.  No auditory or visual hallucinations.  No paranoia  or psychosis.  Cognition  well-preserved.   ADMISSION DIAGNOSES:   AXIS I:  1. Alcohol dependence.  2. Rule out anxiety disorder not otherwise specified.   AXIS II:  No diagnosis.   AXIS III:  Prostate cancer.   AXIS IV:  Moderate.   AXIS V:  Global Assessment of Functioning upon admission 28; highest in the  last year 69.   LABORATORY DATA:  CBC within normal limits.  Blood chemistry within normal  limits.  Thyroid profile within normal limits.  Drug screen positive for  benzodiazepines.   HOSPITAL COURSE:  He was admitted and he was started on Librium detox.  He  was maintained on the Prozac 40 mg that he was taking.  He was later  decreased to 20 mg.  He was given trazodone for sleep and Seroquel 25 mg  every six hours as needed.  He was endorsing feeling weak, depressed, tired,  not out  of bed, feeling quite depressed.  So we went ahead and switched from  Prozac and we started Effexor XR 37.5 mg.  The detox continued.  Claimed to  have a rough time.  Mood was depressed.  Affect was depressed.  He was not  spontaneous.  Still minimized.  Claimed he was willing to abstain to feel  better.  We started working on increase in insight, a relapse prevention  plan.  He continued to evidence psychomotor retardation.  We went ahead and  increased Effexor to 75 mg per day.  Slowly, he started getting better.  He  was less shaky.  He was able to ambulate.  He had an episode of acute  dizziness and an episode of syncope.  He was taken to the emergency room  where he was checked.  He was dehydrated.  IV fluids were administered.  By  June 11, 2003, he was feeling much better.  There were no suicidal  ideation, no homicidal ideation, no hallucinations, no delusions.  Was  willing to pursue further outpatient treatment and long-term abstinence.  As  he indeed was looking much better, we went ahead and discharged to  outpatient follow-up.   DISCHARGE DIAGNOSES:   AXIS I:  1. Alcohol dependence.  2. Major  depression.   AXIS II:  No diagnosis.   AXIS III:  Prostate cancer.   AXIS IV:  Moderate.   AXIS V:  Global Assessment of Functioning upon discharge 50.   DISCHARGE MEDICATIONS:  1. Trazodone 100 mg at night.  2. Effexor XR 75 mg daily.  3. Seroquel 25 mg at bedtime as needed.   FOLLOW UP:  [facility].                                               Geoffery Lyons, M.D.    IL/MEDQ  D:  06/30/2003  T:  06/30/2003  Job:  [id number]

## 2010-10-13 NOTE — H&P (Signed)
NAME:  Tim Foster, Tim Foster NO.:  [id number]   MEDICAL RECORD NO.:  [id number]                   PATIENT TYPE:  IPS   LOCATION:  0506                                 FACILITY:  [facility]   PHYSICIAN:  Jeanice Lim, M.D.              DATE OF BIRTH:  08/10/1933   DATE OF ADMISSION:  06/03/2003  DATE OF DISCHARGE:                         PSYCHIATRIC ADMISSION ASSESSMENT   IDENTIFYING INFORMATION:  This is a 77 year old white male who is a  voluntary admission.  He is married.   HISTORY OF PRESENT ILLNESS:  This patient with a 45 year history of alcohol  abuse decided to quit drinking about 4 days ago.  He had been worried that  his drinking would interfere with his cancer medication that he is taking  for a 52-month old diagnosis of prostate cancer.  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  At that time, he noticed that he had been worrying and obsessing about  things more since he was at home with a lot of time on his hands.  Then his  worrying became worse since he was diagnosed with prostate cancer  approximately 2 months ago.  He worries quite a bit about money, although he  says actually he is not sure that they have any financial problems.  He has  had suicidal ideation for about 2 weeks, without any clear plan but had made  a statement to his wife that he thought he should just go [place] the [place]  and do whatever he felt was necessary.  He finds himself ruminating and his  thoughts going over and over, although he denies rapid thoughts, denies any  history of mood lability or mania, denies any auditory or visual  hallucinations or homicidal ideation.  He had been drinking approximately 8-  10 beers every day for the last 40-45 years, with no clear period sober.  Since he took his last drink this past Sunday, January 2, and he  reports he  has been very anxious, shaky and unable to sleep for the past 2-3 days.   PAST PSYCHIATRIC HISTORY:  This is the patient's first inpatient psychiatric  treatment.  He has been treated for his depression by his primary care  physician.  He does have a history of one prior admission to [facility] approximately 10 years ago, reports that most recently his worry  and anxiety has gotten worse over the past 2 months.  He has no current  outpatient psychiatrist.   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  His family is  supportive.  He has a history of military service with an honorable  discharge, no current legal problems.  Some financial stressors, mostly  facing the unknown about what the bills will be for his prostate cancer.   FAMILY HISTORY:  The patient denies any history of mental illness or  substance abuse.  He does have a family history of coronary artery disease  with at least one brother who died of myocardial infarction.  He is one of 6  children.   ALCOHOL AND DRUG HISTORY:  The patient is currently followed by Dr.  Aldean Ast, M.D., here in [place] for his prostate cancer.  He also has a  history of tobacco abuse.   PAST MEDICAL HISTORY:  Past medical history is remarkable for right knee  surgery.  No clear history of seizures although he did say he fell out and  fainted on the sidewalk x1 last week.  He denies any history of heart  disease, heart attack, or hospitalizations other than the knee surgery.  He  does have a history of tobacco abuse and smokes a couple of packs of  cigarettes a day for 40 years.   MEDICATIONS:  Prozac 40 mg p.o. daily for the past 2-3 months which he  thinks is not helping him.  Casodex 50 mg p.o. daily for prostate cancer,  Restoril 30 mg p.o. q.h.s.  He denies taking any benzodiazepines or any  other substances.   DRUG ALLERGIES:   None.   REVIEW OF SYSTEMS:  Remarkable for the patient reporting being tremulous  with increasing severity over the past 3-4 days.  He has been unable to  sleep for 2-3 days because of obsessive worrying and he feels queasy and  nauseated, unable to eat anything for the past 3 days, reports that his  thoughts are jumping back and forth in his head but denies that his thoughts  are fast.  He denies any intrusive thinking.  The patient reports a weight  loss of approximately 5-10 pounds within the past month.   POSITIVE PHYSICAL FINDINGS:  The patient's physical examination is  documented in the record and is generally remarkable for the fact that he  has considerable tremulousness.  This is generally a well-nourished, well-  developed but somewhat haggard and disheveled white male with poor hygiene.  His clothes are quite baggy on admission to the unit.  Temperature 99, pulse  119, blood pressure 135/97.  He is 5 feet 10.5 inches tall, 162 pounds.  His  physical examination is remarkable for heart rate of 98 currently apically  at 1 full minute.  S1 and S2 heard.  Regular rate and rhythm.  LUNGS:  Scattered rare wheezes that clear with deep breathing.  He is  currently wearing a nicotine patch.  His fingers are tobacco stained.  ABDOMEN:  Flat, soft and nontender.  No masses appreciated.  NEUROLOGIC EXAM:  1 beat of nystagmus with EOMs in all direction, otherwise  intact.  Cranial nerves II-XII intact.  Facial and motor symmetry is  present.  No ataxia.  Gait is grossly normal, but the patient does have  constant fine motor tremor.  Romberg without findings.  No focal  neurological findings.   DIAGNOSTIC STUDIES:  A normal CMET and CBC with liver enzymes normal.  TSH  is currently pending as is his urine drug screen and urinalysis.   MENTAL STATUS EXAM:  This is a fully alert male, with an anxious affect.  He  is tremulous but cooperative and pleasant.  Affect is somewhat  blunted. Speech is normal in amount, pace and tone.  Mood  is depressed, helpless and  anxious.  Thought process is logical and coherent, positive for suicidal  ideation with no clear plan, no homicidal ideation, no auditory or visual  hallucinations, no paranoia, no psychosis.  Cognitively intact and oriented  x3.  Intelligence is average to above average.  Insight is satisfactory,  judgment within normal limits.   ADMISSION DIAGNOSIS:   AXIS I:  1. Rule out anxiety disorder not otherwise specified.  2. Ethyl alcohol abuse and dependence.   AXIS II:  Deferred.   AXIS III:  Tobacco abuse, prostate cancer by history.   AXIS IV:  Moderate stress from economic problems and his medical problems,  with a new diagnosis of prostate cancer.   AXIS V:  Current 28, past year 4.   INITIAL PLAN OF CARE:  Voluntarily admit the patient with q.15 minute checks  in place.  We are going to decrease his Prozac to 20 mg p.o. daily since he  feels he has not gotten any positive effects from this.  We are going to  discontinue the Restoril and place him on a Librium detox protocol and he  will get a 25 mg loading dose with that now.  We will start him on trazodone  100 mg p.o. q.h.s. p.r.n., give him Seroquel 25 mg q.6 hours p.r.n. should  he have any thought agitation, and place him on a Nicoderm patch after we  get him stabilized and less tremulous and able to sleep on the Librium  protocol, then we will evaluate his need for further SSRI therapy.  We have  discussed the plan of care with the patient and he is in agreement with the  plan.   ESTIMATED LENGTH OF STAY:  5 days.     Margaret A. Stephannie Peters                   Jeanice Lim, M.D.    MAS/MEDQ  D:  06/04/2003  T:  06/04/2003  Job:  [id number]

## 2010-10-13 NOTE — Op Note (Signed)
NAME:  Tim Foster, Tim Foster NO.:  [id number]   MEDICAL RECORD NO.:  [id number]                   PATIENT TYPE:  AMB   LOCATION:  [facility]                                 FACILITY:  [facility]   PHYSICIAN:  Rozanna Boer., M.D.      DATE OF BIRTH:  [birth year]   DATE OF PROCEDURE:  08/24/2003  DATE OF DISCHARGE:                                 OPERATIVE REPORT   PREOPERATIVE DIAGNOSIS:  Brachytherapy of the prostate plus cysto.   ANESTHESIA:  General.   SURGEON:  Kimbrough/Goodchild.   BRIEF HISTORY:  This 77 year old patient is admitted with a clinical T1A  Gleason 3 + 3 left-sided adenocarcinoma of the prostate, has been downsized  with Lupron and Casodex and enters now for brachytherapy.  He does smoke,  has alcoholism, has anxiety disorder, and his PSA before the biopsy was  7.14.  He enters now to have his surgery done, accepting the risks,  complications as explained to him.   DESCRIPTION OF PROCEDURE:  The patient was placed on the operating table in  the dorsal lithotomy position.  After satisfactory induction of general  anesthesia, was prepped and draped with Betadine.  A rectal tube was  inserted, Foley catheter, and the 7.5 MHz rectal ultrasound.  The patient  was positioned on the table to correspond to the preoperative planning  films, and fluoroscopy was also used.  Using the computer, the base all the  way to the apex was then captured, and the placement of the seeds were  located.  The prostate was marked out as well as the urethra, and a few  seeds had to be changed by Dr. Dan Humphreys to effect better coverage and to  spare the urethra and the rectum adequately.  After this was done, the  holding needles were then placed and then the marking needle, and the  treatment was begun.  A total of 92 seeds of I-125 were implanted with 22  needles.  There was a little bit of a problem with his bone that had to  cause some of the needles to be  changed but when the implant was finally  done, it looked like there was adequate and complete coverage.  The rectal  ultrasound was then removed, and the patient was then re-prepped and draped,  and a cystoscope was used, and the flexible panendoscope was then used to  inspect the anterior urethra.  No anterior strictures were seen; plus  urethra showed no evidence of any seeds or trauma, and the bladder was 2+  trabeculated, and the retrospective view of the bladder neck showed no  evidence of any seeds in the prostate or into the bladder.  The scope was  then removed, and a #18 Foley catheter inserted and left to straight  drainage.  The patient was then taken to the recovery area in good condition  to be later sent out as an outpatient.  We will remove his Foley  catheter in  48 hours.  The patient tolerated the procedure well and will be sent home  with detailed written instructions.                                               Rozanna Boer., M.D.   HMK/MEDQ  D:  08/24/2003  T:  08/24/2003  Job:  [id number]   cc:   Wynn Banker, M.D.  [address] [facility]  [place]  [place] [address]  Fax: [phone]

## 2010-12-03 IMAGING — CR DG PORTABLE PELVIS
1 series · 1 of 1 positions shown · non-contrast
Comparison: [HOSPITAL] AP pelvis right hip radiographs
06/12/2009.

CLINICAL DATA: Right total hip prosthesis postop.

PORTABLE PELVIS

[view not recorded]
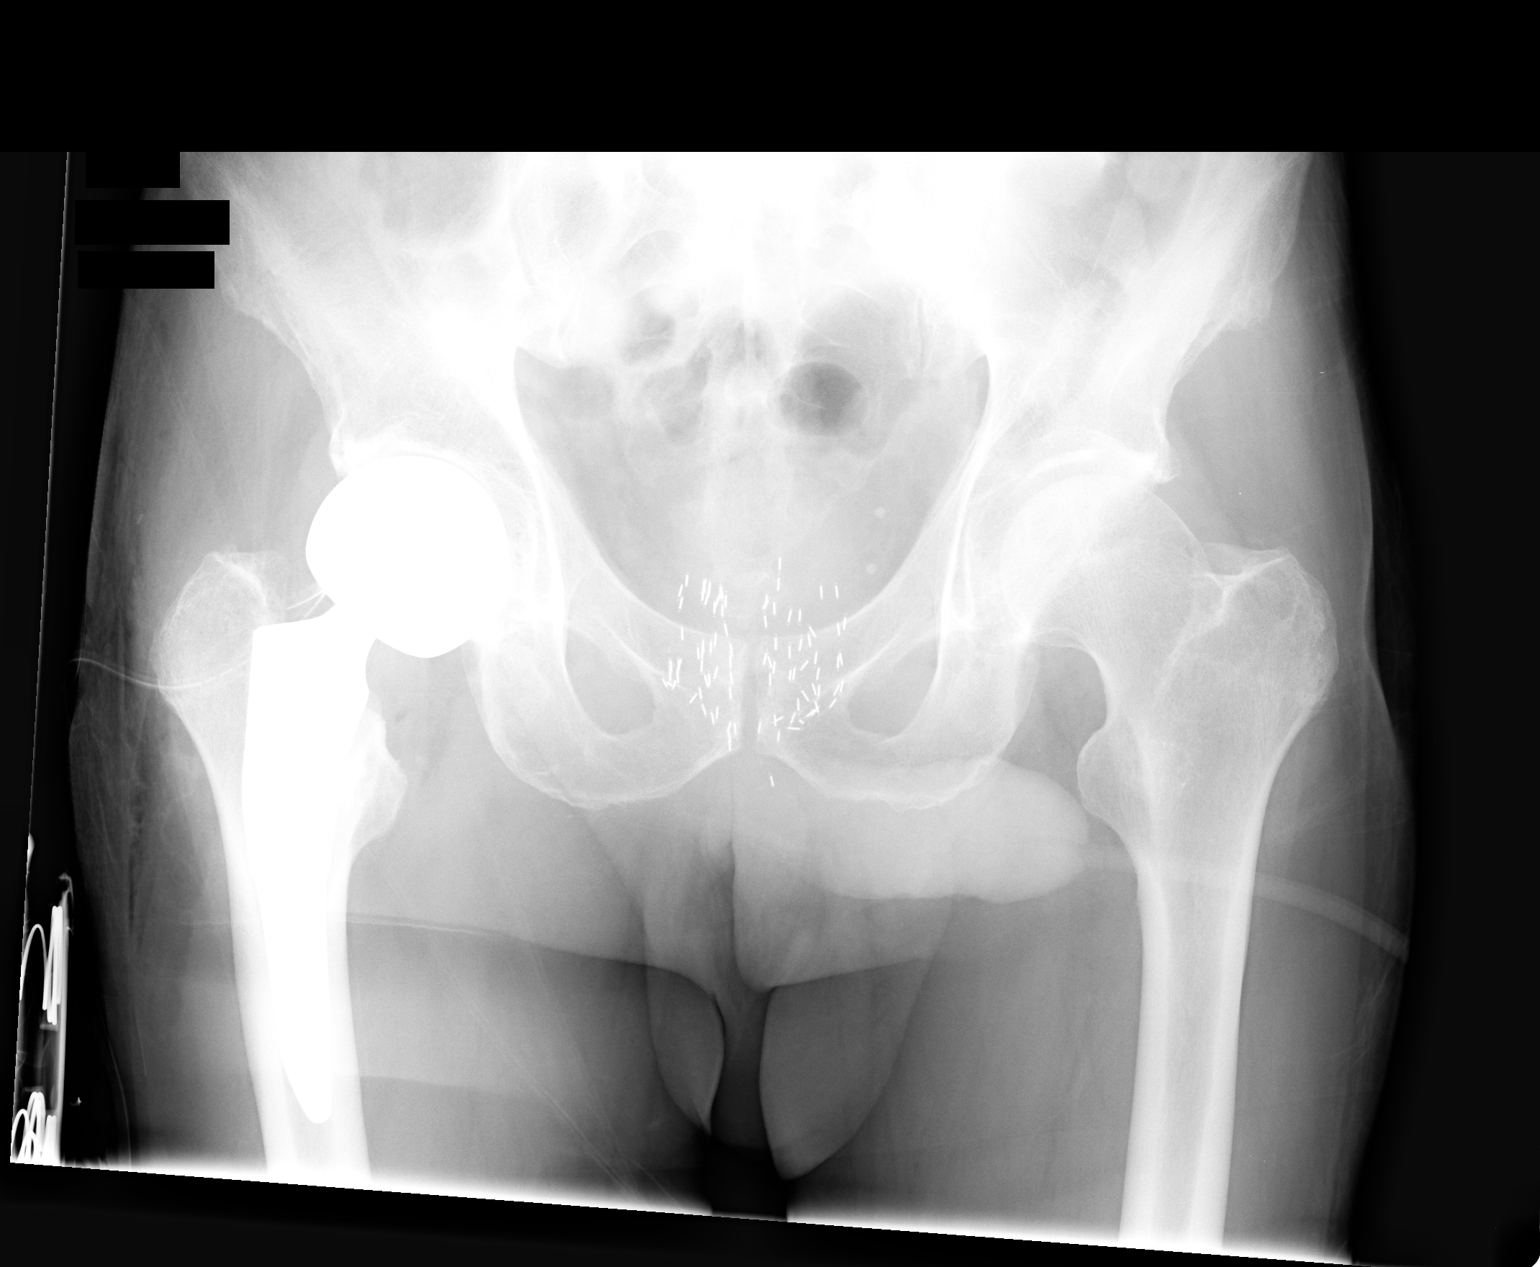

[1 of 1 positions shown; findings below may reference images not displayed]

FINDINGS: Interval satisfactory position alignment of total right
hip prosthesis seen.  Remaining bony pelvis and prostatic bed
brachytherapy unchanged.
IMPRESSION: 1.  Interval satisfactory position and alignment right total hip
prosthesis.
2.  Otherwise, stable.

## 2011-04-23 ENCOUNTER — Encounter: Payer: Self-pay | Admitting: Internal Medicine

## 2011-04-23 DIAGNOSIS — Z0000 Encounter for general adult medical examination without abnormal findings: Secondary | ICD-10-CM | POA: Insufficient documentation

## 2011-04-26 ENCOUNTER — Encounter: Payer: Self-pay | Admitting: Internal Medicine

## 2011-04-27 ENCOUNTER — Ambulatory Visit (INDEPENDENT_AMBULATORY_CARE_PROVIDER_SITE_OTHER): Payer: Medicare PPO | Admitting: Internal Medicine

## 2011-04-27 ENCOUNTER — Other Ambulatory Visit (INDEPENDENT_AMBULATORY_CARE_PROVIDER_SITE_OTHER): Payer: Medicare PPO

## 2011-04-27 ENCOUNTER — Encounter: Payer: Self-pay | Admitting: Internal Medicine

## 2011-04-27 ENCOUNTER — Other Ambulatory Visit: Payer: Self-pay | Admitting: Internal Medicine

## 2011-04-27 VITALS — BP 102/70 | HR 91 | Temp 97.3°F | Ht 69.0 in | Wt 144.4 lb

## 2011-04-27 DIAGNOSIS — Z125 Encounter for screening for malignant neoplasm of prostate: Secondary | ICD-10-CM

## 2011-04-27 DIAGNOSIS — F329 Major depressive disorder, single episode, unspecified: Secondary | ICD-10-CM

## 2011-04-27 DIAGNOSIS — Z0000 Encounter for general adult medical examination without abnormal findings: Secondary | ICD-10-CM

## 2011-04-27 DIAGNOSIS — I10 Essential (primary) hypertension: Secondary | ICD-10-CM

## 2011-04-27 DIAGNOSIS — F3289 Other specified depressive episodes: Secondary | ICD-10-CM

## 2011-04-27 DIAGNOSIS — J209 Acute bronchitis, unspecified: Secondary | ICD-10-CM

## 2011-04-27 LAB — HEPATIC FUNCTION PANEL
Albumin: 4.1 g/dL (ref 3.5–5.2)
Total Bilirubin: 0.7 mg/dL (ref 0.3–1.2)

## 2011-04-27 LAB — URINALYSIS, ROUTINE W REFLEX MICROSCOPIC
Bilirubin Urine: NEGATIVE
Leukocytes, UA: NEGATIVE
Specific Gravity, Urine: 1.025 (ref 1.000–1.030)
Total Protein, Urine: NEGATIVE
Urine Glucose: NEGATIVE
pH: 5.5 (ref 5.0–8.0)

## 2011-04-27 LAB — LIPID PANEL
HDL: 61.1 mg/dL (ref >39.00)
Triglycerides: 93 mg/dL (ref 0.0–149.0)
VLDL: 18.6 mg/dL (ref 0.0–40.0)

## 2011-04-27 LAB — CBC WITH DIFFERENTIAL/PLATELET
Basophils Relative: 0.8 % (ref 0.0–3.0)
Eosinophils Relative: 2.4 % (ref 0.0–5.0)
HCT: 44.8 % (ref 39.0–52.0)
Hemoglobin: 15.3 g/dL (ref 13.0–17.0)
Lymphs Abs: 1.7 10*3/uL (ref 0.7–4.0)
MCV: 94.8 fl (ref 78.0–100.0)
Monocytes Absolute: 0.8 10*3/uL (ref 0.1–1.0)
Monocytes Relative: 9.4 % (ref 3.0–12.0)
Neutro Abs: 6 10*3/uL (ref 1.4–7.7)
RBC: 4.73 Mil/uL (ref 4.22–5.81)
WBC: 8.8 10*3/uL (ref 4.5–10.5)

## 2011-04-27 LAB — LDL CHOLESTEROL, DIRECT: Direct LDL: 164.6 mg/dL

## 2011-04-27 LAB — BASIC METABOLIC PANEL
BUN: 24 mg/dL — ABNORMAL HIGH (ref 6–23)
Calcium: 9 mg/dL (ref 8.4–10.5)
GFR: 63.94 mL/min (ref >60.00)
Glucose, Bld: 88 mg/dL (ref 70–99)
Potassium: 4.9 mEq/L (ref 3.5–5.1)

## 2011-04-27 LAB — TSH: TSH: 2.34 u[IU]/mL (ref 0.35–5.50)

## 2011-04-27 LAB — PSA: PSA: 0.18 ng/mL (ref 0.10–4.00)

## 2011-04-27 MED ORDER — HYDROCODONE-HOMATROPINE 5-1.5 MG/5ML PO SYRP: 5.0000 mL | ORAL_SOLUTION | Freq: Four times a day (QID) | ORAL | Status: AC | PRN

## 2011-04-27 NOTE — Patient Instructions (Addendum)
You had the flu shot today Take all new medications as prescribed - the cough medicine Continue all other medications as before Please go to LAB in the Basement for the blood and/or urine tests to be done today Please call the phone number [phone] (the PhoneTree System) for results of testing in 2-3 days;  When calling, simply dial the number, and when prompted enter the MRN number above (the Medical Record Number) and the # key, then the message should start. Your DMV form will be filled out and sent in Please return in 1 year for your yearly visit, or sooner if needed, with Lab testing done 3-5 days before

## 2011-04-27 NOTE — Assessment & Plan Note (Signed)
Mild to mod, c/w viral illness, for cough med prn,  to f/u any worsening symptoms or concerns 

## 2011-04-28 ENCOUNTER — Encounter: Payer: Self-pay | Admitting: Internal Medicine

## 2011-04-28 ENCOUNTER — Other Ambulatory Visit: Payer: Self-pay | Admitting: Internal Medicine

## 2011-04-28 MED ORDER — ATORVASTATIN CALCIUM 10 MG PO TABS: 10.0000 mg | ORAL_TABLET | Freq: Every day | ORAL | Status: DC

## 2011-04-28 NOTE — Assessment & Plan Note (Signed)
stable overall by hx and exam, most recent data reviewed with pt, and pt to continue medical treatment as before  BP Readings from Last 3 Encounters:  04/27/11 102/70  04/17/10 100/70  02/20/10 102/80

## 2011-04-28 NOTE — Assessment & Plan Note (Signed)
stable overall by hx and exam, , and pt to continue medical treatment as before, again declines further tx

## 2011-04-28 NOTE — Progress Notes (Signed)
Subjective:    Patient ID: Tim Foster, male    DOB: March 29, 1934, 77 y.o.   MRN: [id number]  HPI  Here for wellness and f/u;  Overall doing ok;  Pt denies CP, worsening SOB, DOE, wheezing, orthopnea, PND, worsening LE edema, palpitations, dizziness or syncope.  Pt denies neurological change such as new Headache, facial or extremity weakness.  Pt denies polydipsia, polyuria, or low sugar symptoms. Pt states overall good compliance with treatment and medications, good tolerability, and trying to follow lower cholesterol diet.  Pt denies worsening depressive symptoms, suicidal ideation or panic. No fever, wt loss, night sweats, loss of appetite, or other constitutional symptoms.  Pt states good ability with ADL's, low fall risk, home safety reviewed and adequate, no significant changes in hearing or vision, and occasionally active with exercise.  Needs driver license form filled out.  Is s/p right hip fx June 2012 with limp to walk but no pain.  Here with acute onset mild to mod 2-3 days ST, HA, general weakness and malaise, with prod cough greenish sputum, but no wheezing or CP. For flu shot today, asks for cough med as he finds it more difficult to sleep than usual. Past Medical History  Diagnosis Date  . ANXIETY 12/22/2007  . BENIGN PROSTATIC HYPERTROPHY 12/22/2007  . COLONIC POLYPS, HX OF 12/22/2007  . COPD 12/22/2007  . DEPRESSION 12/22/2007  . FATIGUE 12/22/2007  . HYPERLIPIDEMIA 12/22/2007  . HYPERTENSION 12/22/2007  . Persistent disorder of initiating or maintaining sleep 12/22/2007  . PROSTATE CANCER, HX OF 12/22/2007  . ED (erectile dysfunction)   . Insomnia    No past surgical history on file.  reports that he has been smoking.  He does not have any smokeless tobacco history on file. He reports that he drinks alcohol. He reports that he does not use illicit drugs. family history includes Depression in his brother and Heart disease in his brother. No Known Allergies Current Outpatient  Prescriptions on File Prior to Visit  Medication Sig Dispense Refill  . Eszopiclone (ESZOPICLONE) 3 MG TABS Take 3 mg by mouth at bedtime. Take immediately before bedtime         Review of Systems Review of Systems  Constitutional: Negative for diaphoresis, activity change, appetite change and unexpected weight change.  HENT: Negative for hearing loss, ear pain, facial swelling, mouth sores and neck stiffness.   Eyes: Negative for pain, redness and visual disturbance.  Respiratory: Negative for shortness of breath and wheezing.   Cardiovascular: Negative for chest pain and palpitations.  Gastrointestinal: Negative for diarrhea, blood in stool, abdominal distention and rectal pain.  Genitourinary: Negative for hematuria, flank pain and decreased urine volume.  Musculoskeletal: Negative for myalgias and joint swelling.  Skin: Negative for color change and wound.  Neurological: Negative for syncope and numbness.  Hematological: Negative for adenopathy.  Psychiatric/Behavioral: Negative for hallucinations, self-injury, decreased concentration and agitation.      Objective:   Physical Exam BP 102/70  Pulse 91  Temp(Src) 97.3 F (36.3 C) (Oral)  Ht 5\' 9"  (1.753 m)  Wt 144 lb 6 oz (65.488 kg)  BMI 21.32 kg/m2  SpO2 98% Physical Exam  VS noted, mild ill apearing Constitutional: Pt is oriented to person, place, and time. Appears well-developed and well-nourished.  HENT:  Head: Normocephalic and atraumatic.  Right Ear: External ear normal.  Left Ear: External ear normal.  Nose: Nose normal.  Mouth/Throat: Oropharynx is clear and moist. Bilat tm's mild erythema.  Sinus nontender.  Pharynx  mild erythema    Eyes: Conjunctivae and EOM are normal. Pupils are equal, round, and reactive to light.  Neck: Normal range of motion. Neck supple. No JVD present. No tracheal deviation present.  Cardiovascular: Normal rate, regular rhythm, normal heart sounds and intact distal pulses.     Pulmonary/Chest: Effort normal and breath sounds normal.  Abdominal: Soft. Bowel sounds are normal. There is no tenderness.  Musculoskeletal: Normal range of motion. Exhibits no edema.  Lymphadenopathy:  Has no cervical adenopathy.  Neurological: Pt is alert and oriented to person, place, and time. Pt has normal reflexes. No cranial nerve deficit.  Skin: Skin is warm and dry. No rash noted.  Psychiatric:  Has  normal mood and affect. Behavior is normal. appears fatigued    Assessment & Plan:

## 2011-04-28 NOTE — Assessment & Plan Note (Signed)

## 2011-04-30 DIAGNOSIS — Z0279 Encounter for issue of other medical certificate: Secondary | ICD-10-CM

## 2011-06-05 ENCOUNTER — Telehealth: Payer: Self-pay | Admitting: Internal Medicine

## 2011-06-05 MED ORDER — TEMAZEPAM 30 MG PO CAPS: 30.0000 mg | ORAL_CAPSULE | Freq: Every evening | ORAL | Status: AC | PRN

## 2011-06-05 NOTE — Telephone Encounter (Signed)
The pt called and is hoping to get an insomnia med to be called into the pharmacy.    Thanks!

## 2011-06-05 NOTE — Telephone Encounter (Signed)
This has been a chronic difficult problem to tx in the past; has the pt found anything in the past that worked for him?

## 2011-06-05 NOTE — Telephone Encounter (Signed)
Called the patient and he has tried things in the past but they did not work and he could not remember the names. He would like to try a prescription please

## 2011-06-05 NOTE — Telephone Encounter (Signed)
Done hardcopy to robin  

## 2011-06-06 NOTE — Telephone Encounter (Signed)
Sent hardcopy to pharmacy [facility]

## 2011-06-06 NOTE — Telephone Encounter (Signed)
Called the patient left message to call back 

## 2011-07-13 ENCOUNTER — Institutional Professional Consult (permissible substitution): Payer: Medicare PPO | Admitting: Internal Medicine

## 2011-07-27 ENCOUNTER — Other Ambulatory Visit: Payer: Self-pay

## 2011-07-27 MED ORDER — TEMAZEPAM 30 MG PO CAPS: 30.0000 mg | ORAL_CAPSULE | Freq: Every evening | ORAL | Status: AC | PRN

## 2011-07-27 NOTE — Telephone Encounter (Signed)
Done hardcopy to robin  

## 2011-07-27 NOTE — Telephone Encounter (Signed)
Faxed hardcopy to pharmacy. 

## 2011-08-09 ENCOUNTER — Ambulatory Visit (INDEPENDENT_AMBULATORY_CARE_PROVIDER_SITE_OTHER): Payer: Medicare PPO | Admitting: Internal Medicine

## 2011-08-09 ENCOUNTER — Encounter: Payer: Self-pay | Admitting: Internal Medicine

## 2011-08-09 VITALS — BP 124/80 | HR 85 | Ht 68.0 in | Wt 145.8 lb

## 2011-08-09 DIAGNOSIS — F17200 Nicotine dependence, unspecified, uncomplicated: Secondary | ICD-10-CM

## 2011-08-09 DIAGNOSIS — G4700 Insomnia, unspecified: Secondary | ICD-10-CM

## 2011-08-09 DIAGNOSIS — Z720 Tobacco use: Secondary | ICD-10-CM

## 2011-08-09 MED ORDER — CLONAZEPAM 0.5 MG PO TABS: ORAL_TABLET | ORAL | Status: DC

## 2011-08-09 NOTE — Progress Notes (Signed)
08/09/11- 80 y M seen for complaint that he doesn't sleep at all. PCP Dr. Jonny Ruiz. 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 Mostly he drove back and forth between here in [place]. On arrival in [place] he was required to rest for 8 hours, and he usually had no trouble sleeping then. Since he has been retired insomnia has been routine. For the past 3 or 4 years he has been going to bed at 8 PM so he can watch TV without bothering his wife and kids. He dozes off without the sleep. Not sleepy in the daytime. No naps. Little caffeine. Little dream recall. Not told that he snores or kicks. Sleep meds- temazepam amd Lunesta had no effect. He admits he gets anxious about trying to sleep. Bedtime 8 PM but says he is awake most of the night for getting up at 8 AM. He is physically comfortable in bed. Does smoke at night. Likes to do yard work for exercise. He has smoked one half pack per day or more for 40 years. Occasional beer. Lives with wife. Medical problems include hypertension, COPD, elevated cholesterol. Surgical history of right hip replacement. No thyroid history. TSH is pending.  ROS-see HPI Constitutional:   No-   weight loss, night sweats, fevers, chills, fatigue, lassitude. HEENT:   No-  headaches, difficulty swallowing, tooth/dental problems, sore throat,       No-  sneezing, itching, ear ache, +nasal congestion, post nasal drip,  CV:  No-   chest pain, orthopnea, PND, swelling in lower extremities, anasarca, dizziness, palpitations Resp: No-   shortness of breath with exertion or at rest.              + productive cough,  + non-productive cough,  No- coughing up of blood.              No-   change in color of mucus.  No- wheezing.   Skin: No-   rash or lesions. GI:  No-   heartburn, indigestion, abdominal pain, nausea, vomiting, GU: No-   dysuria,  MS:  No-   joint pain or swelling.   Neuro-     nothing unusual Psych:  No- change in mood or affect. No  depression or anxiety.  No memory loss.  OBJ- Physical Exam General- Alert, Oriented, Affect-appropriate, Distress- none acute, slender. Strong odor of tobacco. Skin- rash-none, lesions- none, excoriation- none Lymphadenopathy- none Head- atraumatic            Eyes- Gross vision intact, PERRLA, conjunctivae and secretions clear            Ears- Hearing, canals-normal            Nose- Externally deviated to right, stuffy, +Septal dev,  No-mucus, polyps, erosion, perforation             Throat- Mallampati II , mucosa clear , drainage- none, tonsils- atrophic Neck- flexible , trachea midline, no stridor , thyroid nl, carotid no bruit Chest - symmetrical excursion , unlabored           Heart/CV- RRR , no murmur , no gallop  , no rub, nl s1 s2                           - JVD- none , edema- none, stasis changes- none, varices- none           Lung- clear to P&A, wheeze- none, cough- none ,  dullness-none, rub- none           Chest wall-  Abd- tender-no, distended-no, bowel sounds-present, HSM- no Br/ Gen/ Rectal- Not done, not indicated Extrem- cyanosis- none, clubbing, none, atrophy- none, strength- nl Neuro- grossly intact to observation

## 2011-08-09 NOTE — Patient Instructions (Signed)
Order- schedule   Split protocol NPSG   Dx OSA with insomnia  Script for clonazepam to try for sleep  Try Breath Right Nasal Strips to hold your nose more open at night  Most people sleep best in a cool, quiet, dark bedroom.

## 2011-08-12 DIAGNOSIS — Z720 Tobacco use: Secondary | ICD-10-CM | POA: Insufficient documentation

## 2011-08-12 NOTE — Assessment & Plan Note (Signed)
Chronic nonspecific insomnia. Depression, lack of exercise and heavy cigarette smoking likely contributing. He will be important to exclude aggravating factors he is not aware of, since he sleeps alone, such as sleep apnea or limb of blood with sleep disturbance. Plan-schedule sleep study. Try clonazepam.

## 2011-08-12 NOTE — Assessment & Plan Note (Signed)
He smokes day and night which is an especially dangerous profile. It's possible that nicotine addiction is contributing to his difficulty sleeping. He probably also aggravates his nasal congestion although he does have septal deviation. Plan-smoking cessation.Try nasal strips.

## 2011-09-03 ENCOUNTER — Ambulatory Visit (HOSPITAL_BASED_OUTPATIENT_CLINIC_OR_DEPARTMENT_OTHER): Payer: Medicare PPO | Attending: Internal Medicine | Admitting: General Practice

## 2011-09-03 VITALS — Ht 68.0 in | Wt 148.0 lb

## 2011-09-03 DIAGNOSIS — G4700 Insomnia, unspecified: Secondary | ICD-10-CM

## 2011-09-03 DIAGNOSIS — G4733 Obstructive sleep apnea (adult) (pediatric): Secondary | ICD-10-CM

## 2011-09-03 DIAGNOSIS — R0609 Other forms of dyspnea: Secondary | ICD-10-CM | POA: Insufficient documentation

## 2011-09-03 DIAGNOSIS — G4761 Periodic limb movement disorder: Secondary | ICD-10-CM | POA: Insufficient documentation

## 2011-09-03 DIAGNOSIS — R0989 Other specified symptoms and signs involving the circulatory and respiratory systems: Secondary | ICD-10-CM | POA: Insufficient documentation

## 2011-09-06 ENCOUNTER — Telehealth: Payer: Self-pay | Admitting: Internal Medicine

## 2011-09-06 ENCOUNTER — Encounter (HOSPITAL_BASED_OUTPATIENT_CLINIC_OR_DEPARTMENT_OTHER): Payer: Medicare PPO

## 2011-09-06 NOTE — Telephone Encounter (Signed)
Spoke with pt and he is requesting referral to orthopedist. I advised to check with his PCP for this. Pt verbalized understanding and states nothing further needed.

## 2011-09-08 DIAGNOSIS — G4761 Periodic limb movement disorder: Secondary | ICD-10-CM

## 2011-09-08 DIAGNOSIS — R0989 Other specified symptoms and signs involving the circulatory and respiratory systems: Secondary | ICD-10-CM

## 2011-09-08 DIAGNOSIS — R0609 Other forms of dyspnea: Secondary | ICD-10-CM

## 2011-09-08 DIAGNOSIS — G4700 Insomnia, unspecified: Secondary | ICD-10-CM

## 2011-09-08 DIAGNOSIS — G4730 Sleep apnea, unspecified: Secondary | ICD-10-CM

## 2011-09-08 NOTE — Procedures (Signed)
NAME:  [name], Tim Foster NO.:  [id number]  MEDICAL RECORD NO.:  [id number]          PATIENT TYPE:  OUT  LOCATION:  SLEEP CENTER                 FACILITY:  [facility]  PHYSICIAN:  Marquisha Nikolov D. Maple Hudson, MD, FCCP, FACPDATE OF BIRTH:  Oct 23, 1935  DATE OF STUDY:  09/03/2011                           NOCTURNAL POLYSOMNOGRAM  REFERRING PHYSICIAN:  Darrion Wyszynski D. Maple Hudson, MD, FCCP, FACP  REFERRING PHYSICIAN:  Kianna Billet D. Jerl Munyan, MD, FCCP, FACP  INDICATION FOR STUDY:  Insomnia with sleep apnea.  EPWORTH SLEEPINESS SCORE:  0/24.  BMI 22, weight 145 pounds, height 68 inches, neck 14.5 inches.  MEDICATIONS:  Home medications are charted and reviewed.  SLEEP ARCHITECTURE:  Total sleep time 270.5 minutes with sleep efficiency 64.8%.  Stage I was 14.8%, stage II 73.6%, stage III absent, REM 11.6% of total sleep time.  Sleep latency 22 minutes, REM latency 180.5 minutes, awake after sleep onset 123 minutes, arousal index 5.3. Sleep was intermittent until after midnight and was repeatedly fragmented by intervals of 10-20 minutes of spontaneous waking between 1 and 3 a.m.  Bedtime medication:  Clonazepam.  RESPIRATORY DATA:  Apnea-hypopnea index (AHI) 0 per hour.  No significant events were recorded.  OXYGEN DATA:  Mild snoring with oxygen desaturation to a nadir of 94% and mean oxygen saturation through the study of 96.2% on room air.  CARDIAC DATA:  Sinus rhythm with frequent PACs.  MOVEMENT-PARASOMNIA:  Very frequent limb jerks.  A total of 384 limb jerks were counted of which 5 were associated with arousals or awakenings for periodic limb movement with arousal index of 1.1 per hour.  No bathroom trips.  IMPRESSIONS-RECOMMENDATION: 1. Difficulty initiating and maintaining sleep as noted, nonspecific,     but at least partly related to limb movement.  Clonazepam was taken     at bedtime. 2. No significant respiratory events with sleep disturbance, within     normal limits.   Apnea/hypopnea index 0 per hour.  Mild snoring with     oxygen desaturation to a nadir of 94% and mean oxygen saturation     through the study of 96.2% on room air. 3. Periodic limb movement with arousal.  A total of 384 limb jerks     were counted of which 5 were associated with arousals or awakenings     for periodic limb movements with arousal index of 1.1 per hour.     Clonazepam may have prevented awakening from these events.     Consider adding specific therapy such as ReQuip or Mirapex if     clinically appropriate.     Jaliel Deavers D. Maple Hudson, MD, [facility], FACP Diplomate, American Board of Sleep Medicine    CDY/MEDQ  D:  09/08/2011 12:07:53  T:  09/08/2011 13:36:35  Job:  [id number]

## 2011-09-10 ENCOUNTER — Ambulatory Visit: Payer: Medicare PPO | Admitting: Internal Medicine

## 2011-11-05 ENCOUNTER — Telehealth: Payer: Self-pay | Admitting: Internal Medicine

## 2011-11-05 MED ORDER — CLONAZEPAM 0.5 MG PO TABS: ORAL_TABLET | ORAL | Status: AC

## 2011-11-05 NOTE — Telephone Encounter (Signed)
Ok to refill clonazepam 

## 2011-11-05 NOTE — Telephone Encounter (Signed)
Pt notified of refill and this was called to [facility] on [address].

## 2011-11-05 NOTE — Telephone Encounter (Signed)
Last OV with CDY on 08/09/11. Last written on 08/09/11 for #60 w/ 1 refill. Pls advise.

## 2013-01-28 ENCOUNTER — Encounter: Payer: Self-pay | Admitting: Internal Medicine

## 2013-12-16 ENCOUNTER — Encounter: Payer: Self-pay | Admitting: Internal Medicine

## 2014-06-03 ENCOUNTER — Encounter: Payer: Self-pay | Admitting: Internal Medicine

## 2017-08-09 ENCOUNTER — Emergency Department (HOSPITAL_COMMUNITY): Payer: Medicare HMO

## 2017-08-09 ENCOUNTER — Inpatient Hospital Stay (HOSPITAL_COMMUNITY): Payer: Medicare HMO

## 2017-08-09 ENCOUNTER — Encounter (HOSPITAL_COMMUNITY): Payer: Self-pay | Admitting: *Deleted

## 2017-08-09 ENCOUNTER — Inpatient Hospital Stay (HOSPITAL_COMMUNITY)
Admission: EM | Admit: 2017-08-09 | Discharge: 2017-08-26 | DRG: 871 | Disposition: E | Payer: Medicare HMO | Attending: Internal Medicine | Admitting: Internal Medicine

## 2017-08-09 DIAGNOSIS — E785 Hyperlipidemia, unspecified: Secondary | ICD-10-CM | POA: Diagnosis present

## 2017-08-09 DIAGNOSIS — E870 Hyperosmolality and hypernatremia: Secondary | ICD-10-CM | POA: Diagnosis not present

## 2017-08-09 DIAGNOSIS — J9811 Atelectasis: Secondary | ICD-10-CM | POA: Diagnosis not present

## 2017-08-09 DIAGNOSIS — F329 Major depressive disorder, single episode, unspecified: Secondary | ICD-10-CM | POA: Diagnosis present

## 2017-08-09 DIAGNOSIS — I10 Essential (primary) hypertension: Secondary | ICD-10-CM | POA: Diagnosis present

## 2017-08-09 DIAGNOSIS — J440 Chronic obstructive pulmonary disease with acute lower respiratory infection: Secondary | ICD-10-CM | POA: Diagnosis present

## 2017-08-09 DIAGNOSIS — J181 Lobar pneumonia, unspecified organism: Secondary | ICD-10-CM

## 2017-08-09 DIAGNOSIS — Z818 Family history of other mental and behavioral disorders: Secondary | ICD-10-CM | POA: Diagnosis not present

## 2017-08-09 DIAGNOSIS — R410 Disorientation, unspecified: Secondary | ICD-10-CM | POA: Diagnosis not present

## 2017-08-09 DIAGNOSIS — N179 Acute kidney failure, unspecified: Secondary | ICD-10-CM

## 2017-08-09 DIAGNOSIS — Z681 Body mass index (BMI) 19 or less, adult: Secondary | ICD-10-CM

## 2017-08-09 DIAGNOSIS — Z79899 Other long term (current) drug therapy: Secondary | ICD-10-CM

## 2017-08-09 DIAGNOSIS — N400 Enlarged prostate without lower urinary tract symptoms: Secondary | ICD-10-CM | POA: Diagnosis present

## 2017-08-09 DIAGNOSIS — G9341 Metabolic encephalopathy: Secondary | ICD-10-CM | POA: Diagnosis not present

## 2017-08-09 DIAGNOSIS — A419 Sepsis, unspecified organism: Secondary | ICD-10-CM | POA: Diagnosis not present

## 2017-08-09 DIAGNOSIS — Z86010 Personal history of colonic polyps: Secondary | ICD-10-CM

## 2017-08-09 DIAGNOSIS — Z8546 Personal history of malignant neoplasm of prostate: Secondary | ICD-10-CM | POA: Diagnosis not present

## 2017-08-09 DIAGNOSIS — R64 Cachexia: Secondary | ICD-10-CM | POA: Diagnosis not present

## 2017-08-09 DIAGNOSIS — F419 Anxiety disorder, unspecified: Secondary | ICD-10-CM | POA: Diagnosis not present

## 2017-08-09 DIAGNOSIS — F17210 Nicotine dependence, cigarettes, uncomplicated: Secondary | ICD-10-CM | POA: Diagnosis present

## 2017-08-09 DIAGNOSIS — J189 Pneumonia, unspecified organism: Secondary | ICD-10-CM | POA: Diagnosis present

## 2017-08-09 DIAGNOSIS — R404 Transient alteration of awareness: Secondary | ICD-10-CM | POA: Diagnosis not present

## 2017-08-09 DIAGNOSIS — J449 Chronic obstructive pulmonary disease, unspecified: Secondary | ICD-10-CM | POA: Diagnosis not present

## 2017-08-09 DIAGNOSIS — G4700 Insomnia, unspecified: Secondary | ICD-10-CM | POA: Diagnosis present

## 2017-08-09 DIAGNOSIS — Z8249 Family history of ischemic heart disease and other diseases of the circulatory system: Secondary | ICD-10-CM

## 2017-08-09 DIAGNOSIS — E860 Dehydration: Secondary | ICD-10-CM

## 2017-08-09 DIAGNOSIS — E872 Acidosis: Secondary | ICD-10-CM | POA: Diagnosis present

## 2017-08-09 DIAGNOSIS — W06XXXA Fall from bed, initial encounter: Secondary | ICD-10-CM | POA: Diagnosis present

## 2017-08-09 DIAGNOSIS — S0990XA Unspecified injury of head, initial encounter: Secondary | ICD-10-CM | POA: Diagnosis not present

## 2017-08-09 DIAGNOSIS — R6520 Severe sepsis without septic shock: Secondary | ICD-10-CM | POA: Diagnosis present

## 2017-08-09 DIAGNOSIS — J4489 Other specified chronic obstructive pulmonary disease: Secondary | ICD-10-CM | POA: Diagnosis present

## 2017-08-09 DIAGNOSIS — R531 Weakness: Secondary | ICD-10-CM | POA: Diagnosis not present

## 2017-08-09 LAB — I-STAT CG4 LACTIC ACID, ED
Lactic Acid, Venous: 7.94 mmol/L (ref 0.5–1.9)
Lactic Acid, Venous: 3.34 mmol/L (ref 0.5–1.9)

## 2017-08-09 LAB — BASIC METABOLIC PANEL
BUN: 117 mg/dL — AB (ref 6–20)
CO2: 17 mmol/L — ABNORMAL LOW (ref 22–32)
Calcium: 7.6 mg/dL — ABNORMAL LOW (ref 8.9–10.3)
Chloride: >130 mmol/L (ref 101–111)
Creatinine, Ser: 3.28 mg/dL — ABNORMAL HIGH (ref 0.61–1.24)
GFR, EST AFRICAN AMERICAN: 19 mL/min — AB (ref >60)
GFR, EST NON AFRICAN AMERICAN: 16 mL/min — AB (ref >60)
Glucose, Bld: 85 mg/dL (ref 65–99)
POTASSIUM: 4.3 mmol/L (ref 3.5–5.1)
SODIUM: 160 mmol/L — AB (ref 135–145)

## 2017-08-09 LAB — CBC WITH DIFFERENTIAL/PLATELET
Basophils Absolute: 0 10*3/uL (ref 0.0–0.1)
Basophils Relative: 0 %
Eosinophils Absolute: 0 10*3/uL (ref 0.0–0.7)
Eosinophils Relative: 0 %
HCT: 44.5 % (ref 39.0–52.0)
Hemoglobin: 14.5 g/dL (ref 13.0–17.0)
Lymphocytes Relative: 4 %
Lymphs Abs: 1.3 10*3/uL (ref 0.7–4.0)
MCH: 30.9 pg (ref 26.0–34.0)
MCHC: 32.6 g/dL (ref 30.0–36.0)
MCV: 94.9 fL (ref 78.0–100.0)
Monocytes Absolute: 1.6 10*3/uL — ABNORMAL HIGH (ref 0.1–1.0)
Monocytes Relative: 5 %
Neutro Abs: 29.7 10*3/uL — ABNORMAL HIGH (ref 1.7–7.7)
Neutrophils Relative %: 91 %
Platelets: 473 10*3/uL — ABNORMAL HIGH (ref 150–400)
RBC: 4.69 MIL/uL (ref 4.22–5.81)
RDW: 15 % (ref 11.5–15.5)
WBC: 32.6 10*3/uL — ABNORMAL HIGH (ref 4.0–10.5)

## 2017-08-09 LAB — COMPREHENSIVE METABOLIC PANEL
ALT: 21 U/L (ref 17–63)
AST: 40 U/L (ref 15–41)
Albumin: 2.1 g/dL — ABNORMAL LOW (ref 3.5–5.0)
Alkaline Phosphatase: 181 U/L — ABNORMAL HIGH (ref 38–126)
Anion gap: 18 — ABNORMAL HIGH (ref 5–15)
BUN: 124 mg/dL — ABNORMAL HIGH (ref 6–20)
CO2: 15 mmol/L — ABNORMAL LOW (ref 22–32)
Calcium: 9 mg/dL (ref 8.9–10.3)
Chloride: 124 mmol/L — ABNORMAL HIGH (ref 101–111)
Creatinine, Ser: 4.05 mg/dL — ABNORMAL HIGH (ref 0.61–1.24)
GFR calc Af Amer: 15 mL/min — ABNORMAL LOW (ref >60)
GFR calc non Af Amer: 13 mL/min — ABNORMAL LOW (ref >60)
Glucose, Bld: 195 mg/dL — ABNORMAL HIGH (ref 65–99)
Potassium: 4.7 mmol/L (ref 3.5–5.1)
Sodium: 157 mmol/L — ABNORMAL HIGH (ref 135–145)
Total Bilirubin: 0.9 mg/dL (ref 0.3–1.2)
Total Protein: 7.4 g/dL (ref 6.5–8.1)

## 2017-08-09 LAB — TROPONIN I: Troponin I: 0.03 ng/mL (ref <0.03)

## 2017-08-09 LAB — PROTIME-INR
INR: 1.38
Prothrombin Time: 16.8 seconds — ABNORMAL HIGH (ref 11.4–15.2)

## 2017-08-09 LAB — CK: Total CK: 374 U/L (ref 49–397)

## 2017-08-09 LAB — LACTIC ACID, PLASMA: Lactic Acid, Venous: 1.3 mmol/L (ref 0.5–1.9)

## 2017-08-09 LAB — VITAMIN B12: VITAMIN B 12: 998 pg/mL — AB (ref 180–914)

## 2017-08-09 LAB — MAGNESIUM: Magnesium: 4.1 mg/dL — ABNORMAL HIGH (ref 1.7–2.4)

## 2017-08-09 MED ORDER — SODIUM CHLORIDE 0.9 % IV SOLN
500.0000 mg | INTRAVENOUS | Status: DC
  Filled 2017-08-09: qty 500

## 2017-08-09 MED ORDER — IPRATROPIUM-ALBUTEROL 0.5-2.5 (3) MG/3ML IN SOLN: 3.0000 mL | Freq: Four times a day (QID) | RESPIRATORY_TRACT | Status: DC | PRN

## 2017-08-09 MED ORDER — SODIUM CHLORIDE 0.9 % IV BOLUS (SEPSIS)
2000.0000 mL | Freq: Once | INTRAVENOUS | Status: AC
Start: 2017-08-09 — End: 2017-08-09
  Administered 2017-08-09: 2000 mL via INTRAVENOUS

## 2017-08-09 MED ORDER — DEXTROSE 5 % IV SOLN
INTRAVENOUS | Status: DC
  Administered 2017-08-09: 20:00:00 via INTRAVENOUS

## 2017-08-09 MED ORDER — NICOTINE 14 MG/24HR TD PT24
14.0000 mg | MEDICATED_PATCH | Freq: Every day | TRANSDERMAL | Status: DC
  Administered 2017-08-09: 14 mg via TRANSDERMAL
  Filled 2017-08-09 (×2): qty 1

## 2017-08-09 MED ORDER — SODIUM CHLORIDE 0.9 % IV BOLUS (SEPSIS)
1000.0000 mL | Freq: Once | INTRAVENOUS | Status: AC
Start: 2017-08-09 — End: 2017-08-09
  Administered 2017-08-09: 1000 mL via INTRAVENOUS

## 2017-08-09 MED ORDER — TEMAZEPAM 15 MG PO CAPS
30.0000 mg | ORAL_CAPSULE | Freq: Every evening | ORAL | Status: DC | PRN
  Administered 2017-08-09: 30 mg via ORAL
  Filled 2017-08-09: qty 2

## 2017-08-09 MED ORDER — DEXTROSE 5 % IV SOLN: INTRAVENOUS | Status: DC

## 2017-08-09 MED ORDER — CEFTRIAXONE SODIUM 1 G IJ SOLR: 1.0000 g | INTRAMUSCULAR | Status: DC

## 2017-08-09 MED ORDER — SODIUM CHLORIDE 0.9 % IV SOLN
2.0000 g | Freq: Once | INTRAVENOUS | Status: AC
  Administered 2017-08-09: 2 g via INTRAVENOUS
  Filled 2017-08-09: qty 2

## 2017-08-09 MED ORDER — SODIUM CHLORIDE 0.9 % IV SOLN
1.0000 g | Freq: Once | INTRAVENOUS | Status: DC
  Filled 2017-08-09: qty 1

## 2017-08-09 MED ORDER — VANCOMYCIN HCL IN DEXTROSE 1-5 GM/200ML-% IV SOLN
1000.0000 mg | Freq: Once | INTRAVENOUS | Status: AC
Start: 2017-08-09 — End: 2017-08-09
  Administered 2017-08-09: 1000 mg via INTRAVENOUS
  Filled 2017-08-09: qty 200

## 2017-08-09 MED ORDER — ENOXAPARIN SODIUM 30 MG/0.3ML ~~LOC~~ SOLN
30.0000 mg | SUBCUTANEOUS | Status: DC
  Administered 2017-08-09: 30 mg via SUBCUTANEOUS
  Filled 2017-08-09: qty 0.3

## 2017-08-09 NOTE — ED Provider Notes (Signed)
[facility]-EMERGENCY DEPT Provider Note   CSN: [id number] Arrival date & time: 2017-09-01  1003     History   Chief Complaint Chief Complaint  Patient presents with  . Fall  . Failure To Thrive    HPI Tim Foster is a 84 y.o. male.  HPI   84 year old male brought in by EMS apparently after falling out of bed.  Patient was found supine on the ground.  Per EMS, the living conditions are very poor to the point that they felt Adult Protective Services should be contacted.  Family apparently reported the patient has been eating and drinking less and more confused.  Unsure of when this change began or what his exact baseline is.  1:15 PM Family arrived to ED. They report patient has been declining for several months. He refuses all help from them. When they try to bathe him he will fight them. He has told them he is ready to die. He hasn't really eaten/drank for several weeks. They question wether he may have dementia. He has been confused for at least several months. Speech is normally hard to understand. Now almost incomprehensible.   Past Medical History:  Diagnosis Date  . ANXIETY 12/22/2007  . BENIGN PROSTATIC HYPERTROPHY 12/22/2007  . COLONIC POLYPS, HX OF 12/22/2007  . COPD 12/22/2007  . DEPRESSION 12/22/2007  . ED (erectile dysfunction)   . FATIGUE 12/22/2007  . HYPERLIPIDEMIA 12/22/2007  . HYPERTENSION 12/22/2007  . Insomnia   . Persistent disorder of initiating or maintaining sleep 12/22/2007  . PROSTATE CANCER, HX OF 12/22/2007    Patient Active Problem List   Diagnosis Date Noted  . Tobacco abuse 08/12/2011  . Acute bronchitis 04/27/2011  . Preventative health care 04/23/2011  . HYPERLIPIDEMIA 12/22/2007  . ANXIETY 12/22/2007  . Persistent disorder of initiating or maintaining sleep 12/22/2007  . DEPRESSION 12/22/2007  . HYPERTENSION 12/22/2007  . COPD 12/22/2007  . BENIGN PROSTATIC HYPERTROPHY 12/22/2007  . FATIGUE 12/22/2007  . PROSTATE  CANCER, HX OF 12/22/2007  . COLONIC POLYPS, HX OF 12/22/2007    No past surgical history on file.     Home Medications    Prior to Admission medications   Medication Sig Start Date End Date Taking? Authorizing Provider  clonazePAM (KLONOPIN) 0.5 MG tablet 1 or 2 tabs at bedtime as needed for sleep 11/05/11 11/04/12  Jetty Duhamel D, MD  temazepam (RESTORIL) 30 MG capsule Take 1 capsule (30 mg total) by mouth at bedtime as needed. 07/27/11   Corwin Levins, MD  atorvastatin (LIPITOR) 10 MG tablet Take 1 tablet (10 mg total) by mouth daily. 04/28/11 08/09/11  Corwin Levins, MD  Eszopiclone (ESZOPICLONE) 3 MG TABS Take 3 mg by mouth at bedtime. Take immediately before bedtime   08/09/11  [provider]    Family History Family History  Problem Relation Age of Onset  . Depression Brother   . Heart disease Brother     Social History Social History   Tobacco Use  . Smoking status: Current Every Day Smoker    Packs/day: 0.50    Years: 40.00    Pack years: 20.00  Substance Use Topics  . Alcohol use: Yes  . Drug use: No     Allergies   Patient has no known allergies.   Review of Systems Review of Systems  Level 5 caveat because of confusion.  Physical Exam Updated Vital Signs BP (!) 90/59 (BP Location: Left Arm)   Pulse 75  Temp (!) 96.3 F (35.7 C) (Rectal)   Resp (!) 30   Ht 5\' 6"  (1.676 m)   Wt 54.4 kg (120 lb)   SpO2 100%   BMI 19.37 kg/m   Physical Exam  Constitutional:  Laying in bed. Cachectic. Disheveled. Appears ill.   HENT:  Head: Normocephalic and atraumatic.  Eyes: Conjunctivae are normal. Right eye exhibits no discharge. Left eye exhibits no discharge.  Neck: Neck supple.  Cardiovascular: Normal rate, regular rhythm and normal heart sounds. Exam reveals no gallop and no friction rub.  No murmur heard. Pulmonary/Chest: Breath sounds normal.  Tachypnea. No adventitious breath sounds appreciated.   Abdominal: Soft. He exhibits no  distension. There is tenderness.  Tenderness in lower abdomen.  Genitourinary:  Genitourinary Comments: Shaft of penis with somewhat macerated appearance with ulceration on L side of glans. Suspect from irritation from being chronically wet.   Musculoskeletal: He exhibits no edema or tenderness.  Extremities cool to touch.   Neurological: He is alert.  Awake. Will respond to questioning but almost in a grunting manner. The only response I could understand was "Freida BusmanAllen" when I asked his name.   Skin: Skin is warm and dry.  Psychiatric: He has a normal mood and affect. His behavior is normal. Thought content normal.  Nursing note and vitals reviewed.    ED Treatments / Results  Labs (all labs ordered are listed, but only abnormal results are displayed) Labs Reviewed  COMPREHENSIVE METABOLIC PANEL - Abnormal; Notable for the following components:      Result Value   Sodium 157 (*)    Chloride 124 (*)    CO2 15 (*)    Glucose, Bld 195 (*)    BUN 124 (*)    Creatinine, Ser 4.05 (*)    Albumin 2.1 (*)    Alkaline Phosphatase 181 (*)    GFR calc non Af Amer 13 (*)    GFR calc Af Amer 15 (*)    Anion gap 18 (*)    All other components within normal limits  CBC WITH DIFFERENTIAL/PLATELET - Abnormal; Notable for the following components:   WBC 32.6 (*)    Platelets 473 (*)    Neutro Abs 29.7 (*)    Monocytes Absolute 1.6 (*)    All other components within normal limits  MAGNESIUM - Abnormal; Notable for the following components:   Magnesium 4.1 (*)    All other components within normal limits  TROPONIN I - Abnormal; Notable for the following components:   Troponin I 0.03 (*)    All other components within normal limits  PROTIME-INR - Abnormal; Notable for the following components:   Prothrombin Time 16.8 (*)    All other components within normal limits  I-STAT CG4 LACTIC ACID, ED - Abnormal; Notable for the following components:   Lactic Acid, Venous 7.94 (*)    All other  components within normal limits  I-STAT CG4 LACTIC ACID, ED - Abnormal; Notable for the following components:   Lactic Acid, Venous 3.34 (*)    All other components within normal limits  CULTURE, BLOOD (ROUTINE X 2)  CULTURE, BLOOD (ROUTINE X 2)  CK  URINALYSIS, ROUTINE W REFLEX MICROSCOPIC  I-STAT CG4 LACTIC ACID, ED    EKG  EKG Interpretation  Date/Time:  Friday August 09 2017 10:21:00 EDT Ventricular Rate:  97 PR Interval:    QRS Duration: 100 QT Interval:  408 QTC Calculation: 519 R Axis:   -78 Text Interpretation:  Sinus or ectopic  atrial rhythm Left anterior fascicular block RSR' in V1 or V2, probably normal variant Prolonged QT interval Confirmed by Raeford Razor [phone]) on 08/22/2017 11:16:58 AM       Radiology Dg Chest Portable 1 View  Result Date: 08/24/2017 CLINICAL DATA:  Hypoxia. EXAM: PORTABLE CHEST 1 VIEW COMPARISON:  06/12/2009. FINDINGS: Right upper lobe atelectasis and consolidation. Adjacent mild right pleural thickening. Right suprahilar mass lesion may be present. No pleural effusion or pneumothorax. Costophrenic angles incompletely imaged. Heart size normal. Degenerative changes thoracic spine. IMPRESSION: 1. Right upper lobe atelectasis and consolidation. Associated adjacent mild pleural thickening. 2. Right suprahilar mass lesion may be present. Contrast-enhanced chest CT should be considered for further evaluation. Electronically Signed   By: Maisie Fus  Register   On: 08/20/2017 11:28    Procedures Procedures (including critical care time)  CRITICAL CARE Performed by: Raeford Razor Total critical care time: 35 minutes Critical care time was exclusive of separately billable procedures and treating other patients. Critical care was necessary to treat or prevent imminent or life-threatening deterioration. Critical care was time spent personally by me on the following activities: development of treatment plan with patient and/or surrogate as well as nursing,  discussions with consultants, evaluation of patient's response to treatment, examination of patient, obtaining history from patient or surrogate, ordering and performing treatments and interventions, ordering and review of laboratory studies, ordering and review of radiographic studies, pulse oximetry and re-evaluation of patient's condition.   Medications Ordered in ED Medications  sodium chloride 0.9 % bolus 2,000 mL (not administered)  ceFEPIme (MAXIPIME) 1 g in sodium chloride 0.9 % 100 mL IVPB (not administered)  vancomycin (VANCOCIN) IVPB 1000 mg/200 mL premix (not administered)     Initial Impression / Assessment and Plan / ED Course  I have reviewed the triage vital signs and the nursing notes.  Pertinent labs & imaging results that were available during my care of the patient were reviewed by me and considered in my medical decision making (see chart for details).     83yM with severe sepsis. BP responding to IVF. Lactic acid improving. Received empiric abx. CXR with likely pneumonia. Still critically ill. Family reports that he has told them several times that he is ready to die and they do not feel he would want aggressive resuscitation (CPR, intubation) if he should worsen.   Final Clinical Impressions(s) / ED Diagnoses   Final diagnoses:  Community acquired pneumonia of right lung, unspecified part of lung  Severe sepsis [facility])  Dehydration    ED Discharge Orders    None       Raeford Razor, MD 08/11/2017 1346

## 2017-08-09 NOTE — ED Notes (Signed)
Attempted in and out. Very scant urine returned. Patient placed on condom cath. Fluids going.

## 2017-08-09 NOTE — ED Notes (Signed)
Date and time results received: 18-May-2018 1229 (use smartphrase ".now" to insert current time)  Test: Trop Critical Value: 0.03  Name of Provider Notified: Winn Jockhelsea K, Kohut  Orders Received? Or Actions Taken?: Actions Taken: Notified EDP and RN

## 2017-08-09 NOTE — ED Notes (Signed)
Bed: WA07 Expected date:  Expected time:  Means of arrival:  Comments: 84 yo fall, FTT

## 2017-08-09 NOTE — ED Notes (Signed)
ED TO INPATIENT HANDOFF REPORT  Name/Age/Gender Tim Foster 84 y.o. male  Code Status Code Status History    This patient does not have a recorded code status. Please follow your organizational policy for patients in this situation.      Home/SNF/Other Home  Chief Complaint fall  Level of Care/Admitting Diagnosis ED Disposition    ED Disposition Condition Comment   Admit  Hospital Area: [place] [[id number]]  Level of Care: Telemetry [5]  Admit to tele based on following criteria: Other see comments  Comments: acute encephalopathy and pneumonia.  Diagnosis: CAP (community acquired pneumonia) [569794]  Admitting Physician: Hosie Poisson [[id number]]  Attending Physician: Hosie Poisson [[id number]]  Estimated length of stay: past midnight tomorrow  Certification:: I certify this patient will need inpatient services for at least 2 midnights  PT Class (Do Not Modify): Inpatient [101]  PT Acc Code (Do Not Modify): Private [1]       Medical History Past Medical History:  Diagnosis Date  . ANXIETY 12/22/2007  . BENIGN PROSTATIC HYPERTROPHY 12/22/2007  . COLONIC POLYPS, HX OF 12/22/2007  . COPD 12/22/2007  . DEPRESSION 12/22/2007  . ED (erectile dysfunction)   . FATIGUE 12/22/2007  . HYPERLIPIDEMIA 12/22/2007  . HYPERTENSION 12/22/2007  . Insomnia   . Persistent disorder of initiating or maintaining sleep 12/22/2007  . PROSTATE CANCER, HX OF 12/22/2007    Allergies No Known Allergies  IV Location/Drains/Wounds Patient Lines/Drains/Airways Status   Active Line/Drains/Airways    None          Labs/Imaging Results for orders placed or performed during the hospital encounter of 08/13/2017 (from the past 48 hour(s))  Comprehensive metabolic panel     Status: Abnormal   Collection Time: 08/22/2017 10:38 AM  Result Value Ref Range   Sodium 157 (H) 135 - 145 mmol/L   Potassium 4.7 3.5 - 5.1 mmol/L   Chloride 124 (H) 101 - 111 mmol/L   CO2 15 (L) 22 - 32 mmol/L    Glucose, Bld 195 (H) 65 - 99 mg/dL   BUN 124 (H) 6 - 20 mg/dL    Comment: RESULTS CONFIRMED BY MANUAL DILUTION   Creatinine, Ser 4.05 (H) 0.61 - 1.24 mg/dL   Calcium 9.0 8.9 - 10.3 mg/dL   Total Protein 7.4 6.5 - 8.1 g/dL   Albumin 2.1 (L) 3.5 - 5.0 g/dL   AST 40 15 - 41 U/L   ALT 21 17 - 63 U/L   Alkaline Phosphatase 181 (H) 38 - 126 U/L   Total Bilirubin 0.9 0.3 - 1.2 mg/dL   GFR calc non Af Amer 13 (L) >60 mL/min   GFR calc Af Amer 15 (L) >60 mL/min    Comment: (NOTE) The eGFR has been calculated using the CKD EPI equation. This calculation has not been validated in all clinical situations. eGFR's persistently <60 mL/min signify possible Chronic Kidney Disease.    Anion gap 18 (H) 5 - 15    Comment: Performed at [facility], [place] [address], [place], [place] [address]  CBC with Differential     Status: Abnormal   Collection Time: 08/07/2017 10:38 AM  Result Value Ref Range   WBC 32.6 (H) 4.0 - 10.5 K/uL   RBC 4.69 4.22 - 5.81 MIL/uL   Hemoglobin 14.5 13.0 - 17.0 g/dL   HCT 44.5 39.0 - 52.0 %   MCV 94.9 78.0 - 100.0 fL   MCH 30.9 26.0 - 34.0 pg   MCHC 32.6 30.0 -  36.0 g/dL   RDW 15.0 11.5 - 15.5 %   Platelets 473 (H) 150 - 400 K/uL   Neutrophils Relative % 91 %   Lymphocytes Relative 4 %   Monocytes Relative 5 %   Eosinophils Relative 0 %   Basophils Relative 0 %   Neutro Abs 29.7 (H) 1.7 - 7.7 K/uL   Lymphs Abs 1.3 0.7 - 4.0 K/uL   Monocytes Absolute 1.6 (H) 0.1 - 1.0 K/uL   Eosinophils Absolute 0.0 0.0 - 0.7 K/uL   Basophils Absolute 0.0 0.0 - 0.1 K/uL   WBC Morphology WHITE COUNT CONFIRMED ON SMEAR     Comment: Performed at [facility], [place] [address], [place], [place] [address]  CK     Status: None   Collection Time: 08/13/2017 10:38 AM  Result Value Ref Range   Total CK 374 49 - 397 U/L    Comment: Performed at [facility], [place] [address], [place], [place] [address]  Magnesium     Status: Abnormal    Collection Time: 08/08/2017 10:38 AM  Result Value Ref Range   Magnesium 4.1 (H) 1.7 - 2.4 mg/dL    Comment: Performed at [facility], [place] [address], [place], [place] [address]  Troponin I     Status: Abnormal   Collection Time: 07/31/2017 10:38 AM  Result Value Ref Range   Troponin I 0.03 (HH) <0.03 ng/mL    Comment: CRITICAL RESULT CALLED TO, READ BACK BY AND VERIFIED WITH: CLAPP,S @ 1229 ON [id number] BY POTEAT,S Performed at [place] [address] [place], [place] [address]   I-Stat CG4 Lactic Acid, ED     Status: Abnormal   Collection Time: 08/08/2017 11:38 AM  Result Value Ref Range   Lactic Acid, Venous 7.94 (HH) 0.5 - 1.9 mmol/L   Comment NOTIFIED PHYSICIAN   Protime-INR     Status: Abnormal   Collection Time: 07/26/2017 11:57 AM  Result Value Ref Range   Prothrombin Time 16.8 (H) 11.4 - 15.2 seconds   INR 1.38     Comment: Performed at [facility], [place] [address] [place], [place] [address]  I-Stat CG4 Lactic Acid, ED     Status: Abnormal   Collection Time: 08/15/2017  1:04 PM  Result Value Ref Range   Lactic Acid, Venous 3.34 (HH) 0.5 - 1.9 mmol/L   Comment NOTIFIED PHYSICIAN    Dg Chest Portable 1 View  Result Date: 08/24/2017 CLINICAL DATA:  Hypoxia. EXAM: PORTABLE CHEST 1 VIEW COMPARISON:  06/12/2009. FINDINGS: Right upper lobe atelectasis and consolidation. Adjacent mild right pleural thickening. Right suprahilar mass lesion may be present. No pleural effusion or pneumothorax. Costophrenic angles incompletely imaged. Heart size normal. Degenerative changes thoracic spine. IMPRESSION: 1. Right upper lobe atelectasis and consolidation. Associated adjacent mild pleural thickening. 2. Right suprahilar mass lesion may be present. Contrast-enhanced chest CT should be considered for further evaluation. Electronically Signed   By: Marcello Moores  Register   On: 08/18/2017 11:28    Pending Labs Unresulted Labs (From admission,  onward)   Start     Ordered   07/26/2017 1038  Culture, blood (Routine x 2)  BLOOD CULTURE X 2,   STAT     07/29/2017 1037   08/23/2017 1038  Urinalysis, Routine w reflex microscopic  STAT,   STAT     08/12/2017 1037   Signed and Held  Gram stain  Once,   R     Signed and  Held   Signed and Held  HIV antibody (Routine Screening)  Once,   R     Signed and Held   Signed and Held  Strep pneumoniae urinary antigen  Once,   R     Signed and Held   Signed and Held  Creatinine, serum  (enoxaparin (LOVENOX)    CrCl < 30 ml/min)  Weekly,   R    Comments:  while on enoxaparin therapy.    Signed and Held   Signed and Held  Culture, sputum-assessment  Once,   R     Signed and Held   Signed and Held  Legionella Pneumophila Serogp 1 Ur Ag  Once,   R     Signed and Held      Vitals/Pain Today's Vitals   07/30/2017 1230 08/11/2017 1331 08/02/2017 1400 08/16/2017 1430  BP: (!) 108/48 (!) 128/112 110/63 113/65  Pulse: 91 70 78 93  Resp: (!) 37 (!) 24 (!) 26 (!) 29  Temp:      TempSrc:      SpO2: 95% 94% 97% 92%  Weight:      Height:        Isolation Precautions No active isolations  Medications Medications  cefTRIAXone (ROCEPHIN) 1 g in sodium chloride 0.9 % 100 mL IVPB (not administered)  azithromycin (ZITHROMAX) 500 mg in sodium chloride 0.9 % 250 mL IVPB (not administered)  sodium chloride 0.9 % bolus 2,000 mL (0 mLs Intravenous Stopped 08/03/2017 1343)  vancomycin (VANCOCIN) IVPB 1000 mg/200 mL premix (0 mg Intravenous Stopped 08/07/2017 1343)  ceFEPIme (MAXIPIME) 2 g in sodium chloride 0.9 % 100 mL IVPB (0 g Intravenous Stopped 08/24/2017 1426)  sodium chloride 0.9 % bolus 1,000 mL (1,000 mLs Intravenous Transfusing/Transfer 08/23/2017 1427)    Mobility Bed bound

## 2017-08-09 NOTE — ED Notes (Signed)
Dr. Kohut at bedside 

## 2017-08-09 NOTE — ED Triage Notes (Signed)
Per EMS, patient comes from home. Pt had fallen out of bed, supine on ground, per family, patient has been eating and drinking less and is more altered. Family doesn't seem know much. Alert, oriented to all except time. No pain. 2L oxygen put on by EMS. Very poor living conditions, family appears to not be taking care of him and he is unable to care for self. Urine in bed, EMS considered calling adult protective services.

## 2017-08-09 NOTE — ED Notes (Signed)
Date and time results received: 07/27/2017 1231 (use smartphrase ".now" to insert current time)  Test: Trop Critical Value: 0.03  Name of Provider Notified: Leeroy Bockhelsea, RN, Juleen ChinaKohut, EDP  Orders Received? Or Actions Taken?: Actions Taken: Occupational hygienistotified RN and EDP

## 2017-08-09 NOTE — H&P (Addendum)
History and Physical    Tim Foster ZOX:[id number] DOB: Sep 20, 1933 DOA: Aug 18, 2017  PCP: [facility], [facility], MD   Patient coming from: Home   I have personally briefly reviewed patient's old medical records in [facility]  Chief Complaint: Fall this morning  HPI: Tim Foster is a 84 y.o. male with medical history significant of hypertension hyperlipidemia, COPD, chronic smoker, depression, Insomnia, BPH, lives at home, was brought in by family after a mechanical fall today. Pt is confused, could not provide any detailed history. There is no family at bedside.called his wife and got most of the history. As per the wife, pt has n't seen a physician in the last 5 years. He has not been eating or drinking, and mostly in bed, which has worsening the last few months. He is confused at baseline. She does not reports any vomiting , diarrhea, fever, or syncopal episodes. He denies any chest pain or abdominal pain. I couldn't get more history from the patient. On arrival to ED, lab work revealed, sodium of 157, bicarb of 15 BUN of 124, creatinine of 4.05, magnesium of 4.1, lactic acid of 7.94, WBC count of 32.6 platelets of 473.  Chest x-ray shows right upper lobe consolidation, with possible right hilar mass.  He was referred to [facility] for admission for pneumonia.    Review of Systems:pt slightly confused and detailed ROS couldn't be obtained.    Past Medical History:  Diagnosis Date  . ANXIETY 12/22/2007  . BENIGN PROSTATIC HYPERTROPHY 12/22/2007  . COLONIC POLYPS, HX OF 12/22/2007  . COPD 12/22/2007  . DEPRESSION 12/22/2007  . ED (erectile dysfunction)   . FATIGUE 12/22/2007  . HYPERLIPIDEMIA 12/22/2007  . HYPERTENSION 12/22/2007  . Insomnia   . Persistent disorder of initiating or maintaining sleep 12/22/2007  . PROSTATE CANCER, HX OF 12/22/2007    No past surgical history on file.   reports that he has been smoking.  He has a 20.00 pack-year smoking history. He does not have any  smokeless tobacco history on file. He reports that he drinks alcohol. He reports that he does not use drugs.  No Known Allergies  Family History  Problem Relation Age of Onset  . Depression Brother   . Heart disease Brother    Family history couldn't be reviewed due to confusion.   Prior to Admission medications   Medication Sig Start Date End Date Taking? Authorizing Provider  clonazePAM (KLONOPIN) 0.5 MG tablet 1 or 2 tabs at bedtime as needed for sleep 11/05/11 11/04/12  Jetty Duhamel D, MD  temazepam (RESTORIL) 30 MG capsule Take 1 capsule (30 mg total) by mouth at bedtime as needed. 07/27/11   Corwin Levins, MD  atorvastatin (LIPITOR) 10 MG tablet Take 1 tablet (10 mg total) by mouth daily. 04/28/11 08/09/11  Corwin Levins, MD  Eszopiclone (ESZOPICLONE) 3 MG TABS Take 3 mg by mouth at bedtime. Take immediately before bedtime   08/09/11  [provider]    Physical Exam: Vitals:   2017-08-18 1200 Aug 18, 2017 1230 18-Aug-2017 1331 August 18, 2017 1400  BP: (!) 142/109 (!) 108/48 (!) 128/112 110/63  Pulse: 94 91 70 78  Resp: (!) 27 (!) 37 (!) 24 (!) 26  Temp:      TempSrc:      SpO2: 100% 95% 94% 97%  Weight:      Height:        Constitutional: cachetic looking gentleman, not in distress on RA.  Vitals:   18-Aug-2017 1200 Aug 18, 2017 1230  08-31-2017 1331 Aug 31, 2017 1400  BP: (!) 142/109 (!) 108/48 (!) 128/112 110/63  Pulse: 94 91 70 78  Resp: (!) 27 (!) 37 (!) 24 (!) 26  Temp:      TempSrc:      SpO2: 100% 95% 94% 97%  Weight:      Height:       Eyes: PERRL, lids and conjunctivae normal ENMT: Mucous membranes are dry , poor dentition.  Neck: normal, supple, no masses, no thyromegaly Respiratory: bilateral rhonchi with diminished air entry at right base.  Cardiovascular: Regular rate and rhythm, no murmurs. No extremity edema. Abdomen: no tenderness, no masses palpated. No hepatosplenomegaly. Bowel sounds positive.  Musculoskeletal: wasting of the lower extremity muscles.  Skin: no  rashes, lesions, ulcers. No induration Neurologic: alert , able to answer simple questions , with yes or no. Doesn't appear to be oriented.  Gross motor strength in all extremities is around 3/5.  Psychiatric: Normal mood.   Labs on Admission: I have personally reviewed following labs and imaging studies  CBC: Recent Labs  Lab 08-31-17 1038  WBC 32.6*  NEUTROABS 29.7*  HGB 14.5  HCT 44.5  MCV 94.9  PLT 473*   Basic Metabolic Panel: Recent Labs  Lab 2017-08-31 1038  NA 157*  K 4.7  CL 124*  CO2 15*  GLUCOSE 195*  BUN 124*  CREATININE 4.05*  CALCIUM 9.0  MG 4.1*   GFR: Estimated Creatinine Clearance: 11 mL/min (A) (by C-G formula based on SCr of 4.05 mg/dL (H)). Liver Function Tests: Recent Labs  Lab 08/31/17 1038  AST 40  ALT 21  ALKPHOS 181*  BILITOT 0.9  PROT 7.4  ALBUMIN 2.1*   No results for input(s): LIPASE, AMYLASE in the last 168 hours. No results for input(s): AMMONIA in the last 168 hours. Coagulation Profile: Recent Labs  Lab 08-31-2017 1157  INR 1.38   Cardiac Enzymes: Recent Labs  Lab 08/31/17 1038  CKTOTAL 374  TROPONINI 0.03*   BNP (last 3 results) No results for input(s): PROBNP in the last 8760 hours. HbA1C: No results for input(s): HGBA1C in the last 72 hours. CBG: No results for input(s): GLUCAP in the last 168 hours. Lipid Profile: No results for input(s): CHOL, HDL, LDLCALC, TRIG, CHOLHDL, LDLDIRECT in the last 72 hours. Thyroid Function Tests: No results for input(s): TSH, T4TOTAL, FREET4, T3FREE, THYROIDAB in the last 72 hours. Anemia Panel: No results for input(s): VITAMINB12, FOLATE, FERRITIN, TIBC, IRON, RETICCTPCT in the last 72 hours. Urine analysis:    Component Value Date/Time   COLORURINE LT. YELLOW 04/27/2011 1410   APPEARANCEUR CLEAR 04/27/2011 1410   LABSPEC 1.025 04/27/2011 1410   PHURINE 5.5 04/27/2011 1410   GLUCOSEU NEGATIVE 04/27/2011 1410   HGBUR TRACE-LYSED 04/27/2011 1410   BILIRUBINUR NEGATIVE  04/27/2011 1410   KETONESUR TRACE 04/27/2011 1410   UROBILINOGEN 0.2 04/27/2011 1410   NITRITE NEGATIVE 04/27/2011 1410   LEUKOCYTESUR NEGATIVE 04/27/2011 1410    Radiological Exams on Admission: Dg Chest Portable 1 View  Result Date: 08-31-17 CLINICAL DATA:  Hypoxia. EXAM: PORTABLE CHEST 1 VIEW COMPARISON:  06/12/2009. FINDINGS: Right upper lobe atelectasis and consolidation. Adjacent mild right pleural thickening. Right suprahilar mass lesion may be present. No pleural effusion or pneumothorax. Costophrenic angles incompletely imaged. Heart size normal. Degenerative changes thoracic spine. IMPRESSION: 1. Right upper lobe atelectasis and consolidation. Associated adjacent mild pleural thickening. 2. Right suprahilar mass lesion may be present. Contrast-enhanced chest CT should be considered for further evaluation. Electronically Signed  ByMaisie Fus: Thomas  Register   On: 08/01/2017 11:28    EKG: Independently reviewed. ECTOPIC atrial rhythm.   Assessment/Plan Active Problems:   CAP (community acquired pneumonia)    Sepsis: On adm pt was hypothermic, tachycardic, tachypnea, and hypotensive, with leukocytosis and elevated lactic acid , hypoxic with sats in 88% on arrival,  with source possibly from RUL pneumonia.  Hydrate, 3 lit bolus given in ED, with maintenance fluids. Repeat lactic acid.  Blood cultures ordered and pending.     Community acquired pneumonia:  - admit for IV antibiotics, blood cultures and sputum cultures ordered.  - [place] oxygen to keep sats greater than 90%.  - urine for strep and legionella antigen.  - please obtain a CT chest with contrast, when creatinine improves to evaluate right suprahilar mass.     ARF with metabolic acidosis:  Suspect from decreased po intake ,  Pre renal probably.  Get us renal to rule out obstruction.  Get UA  And urine electrolytes.  Avoid nephrotoxins.  Bicarb at 15, monitor.    Hypertension:  bp parameters are borderline low.  Monitor.    Acute encephalopathy probably metabolic in origin from hypernatremia. Vs worsening of his dementia.  Free water deficit from decreased po intake.  CT head without contrast, vit b12 and TSH. Dietary consult.  Encourage po intake.    Hypermagnesemia:  Probably dehydration.  Hydrate and repeat renal parameters in am.    COPD: no wheezing heard. duonebs as needed.   Chronic smoker: Will add nicotine patch.    H/o BPH:  Monitor urine output.       DVT prophylaxis: lovenox.  Code Status: presumed full code.  Family Communication: discussed with wife over the phone.  Disposition Plan: pending management of pneumonia and clinical improvement.  Consults called: palliative care for goc.  Admission status: inpatient/tele    Kathlen ModyVijaya Jalynn Waddell MD [facility] Pager [phone]  If 7PM-7AM, please contact night-coverage [email or web address] Password [email or web address]  08/21/2017, 2:25 PM

## 2017-08-09 NOTE — ED Notes (Signed)
Attempted to call report; RN busy; will call back.

## 2017-08-09 NOTE — Progress Notes (Signed)
Palliative Medicine consult noted. Due to high referral volume, there may be a delay seeing this patient. Please call the Palliative Medicine Team office at [phone] if recommendations are needed in the interim.  Thank you for inviting us to see this patient.  Margret ChanceMelanie G. Adiel Mcnamara, RN, BSN, [facility] Palliative Medicine Team 08/04/2017 2:57 PM Office [phone]

## 2017-08-10 ENCOUNTER — Inpatient Hospital Stay (HOSPITAL_COMMUNITY): Payer: Medicare HMO | Admitting: Anesthesiology

## 2017-08-10 DIAGNOSIS — J189 Pneumonia, unspecified organism: Secondary | ICD-10-CM

## 2017-08-10 LAB — BASIC METABOLIC PANEL
ANION GAP: 14 (ref 5–15)
ANION GAP: 14 (ref 5–15)
BUN: 121 mg/dL — AB (ref 6–20)
BUN: 123 mg/dL — AB (ref 6–20)
CHLORIDE: 130 mmol/L — AB (ref 101–111)
CO2: 15 mmol/L — ABNORMAL LOW (ref 22–32)
CO2: 14 mmol/L — ABNORMAL LOW (ref 22–32)
Calcium: 8.1 mg/dL — ABNORMAL LOW (ref 8.9–10.3)
Calcium: 7.7 mg/dL — ABNORMAL LOW (ref 8.9–10.3)
Creatinine, Ser: 3.53 mg/dL — ABNORMAL HIGH (ref 0.61–1.24)
Creatinine, Ser: 3.34 mg/dL — ABNORMAL HIGH (ref 0.61–1.24)
GFR calc Af Amer: 17 mL/min — ABNORMAL LOW (ref >60)
GFR calc Af Amer: 18 mL/min — ABNORMAL LOW (ref >60)
GFR, EST NON AFRICAN AMERICAN: 15 mL/min — AB (ref >60)
GFR, EST NON AFRICAN AMERICAN: 16 mL/min — AB (ref >60)
GLUCOSE: 142 mg/dL — AB (ref 65–99)
GLUCOSE: 93 mg/dL (ref 65–99)
POTASSIUM: 5.1 mmol/L (ref 3.5–5.1)
Potassium: 5.6 mmol/L — ABNORMAL HIGH (ref 3.5–5.1)
Sodium: 159 mmol/L — ABNORMAL HIGH (ref 135–145)
Sodium: 160 mmol/L — ABNORMAL HIGH (ref 135–145)

## 2017-08-10 LAB — GLUCOSE, CAPILLARY: Glucose-Capillary: 112 mg/dL — ABNORMAL HIGH (ref 65–99)

## 2017-08-11 MED FILL — Medication: Qty: 1 | Status: AC

## 2017-08-14 LAB — CULTURE, BLOOD (ROUTINE X 2)
Culture: NO GROWTH
Culture: NO GROWTH
Special Requests: ADEQUATE
Special Requests: ADEQUATE

## 2017-08-26 NOTE — Anesthesia Procedure Notes (Signed)
Procedure Name: Intubation Date/Time: Aug 30, 2017 12:58 AM Performed by: Anne Fu, CRNA Pre-anesthesia Checklist: Patient identified, Emergency Drugs available, Suction available, Patient being monitored and Timeout performed Patient Re-evaluated:Patient Re-evaluated prior to induction Oxygen Delivery Method: Circle system utilized Preoxygenation: Pre-oxygenation with 100% oxygen Induction Type: IV induction Ventilation: Mask ventilation without difficulty Laryngoscope Size: Mac and 4 Grade View: Grade I Tube type: Oral Tube size: 7.5 mm Number of attempts: 1 Airway Equipment and Method: Stylet Placement Confirmation: ETT inserted through vocal cords under direct vision,  positive ETCO2 and breath sounds checked- equal and bilateral Secured at: 21 cm Tube secured with: Tape Dental Injury: Teeth and Oropharynx as per pre-operative assessment

## 2017-08-26 NOTE — ED Provider Notes (Signed)
[facility]  Department of Emergency Medicine   Code Blue CONSULT NOTE  Chief Complaint: Cardiac arrest/unresponsive   Level V Caveat: Unresponsive  History of present illness: I was contacted by the hospital for a CODE BLUE cardiac arrest upstairs and presented to the patient's bedside.    ROS: Unable to obtain, Level V caveat  Scheduled Meds: . enoxaparin (LOVENOX) injection  30 mg Subcutaneous Q24H  . nicotine  14 mg Transdermal Daily   Continuous Infusions: . azithromycin    . cefTRIAXone (ROCEPHIN)  IV    . dextrose 50 mL/hr at 08-13-2017 2028   PRN Meds:.ipratropium-albuterol, temazepam Past Medical History:  Diagnosis Date  . ANXIETY 12/22/2007  . BENIGN PROSTATIC HYPERTROPHY 12/22/2007  . COLONIC POLYPS, HX OF 12/22/2007  . COPD 12/22/2007  . DEPRESSION 12/22/2007  . ED (erectile dysfunction)   . FATIGUE 12/22/2007  . HYPERLIPIDEMIA 12/22/2007  . HYPERTENSION 12/22/2007  . Insomnia   . Persistent disorder of initiating or maintaining sleep 12/22/2007  . PROSTATE CANCER, HX OF 12/22/2007   History reviewed. No pertinent surgical history. Social History   Socioeconomic History  . Marital status: Married    Spouse name: Not on file  . Number of children: Not on file  . Years of education: Not on file  . Highest education level: Not on file  Social Needs  . Financial resource strain: Not on file  . Food insecurity - worry: Not on file  . Food insecurity - inability: Not on file  . Transportation needs - medical: Not on file  . Transportation needs - non-medical: Not on file  Occupational History  . Not on file  Tobacco Use  . Smoking status: Current Every Day Smoker    Packs/day: 0.50    Years: 40.00    Pack years: 20.00  Substance and Sexual Activity  . Alcohol use: Yes  . Drug use: No  . Sexual activity: Not on file  Other Topics Concern  . Not on file  Social History Narrative  . Not on file   No Known Allergies  Last set of Vital  Signs (not current) Vitals:   Aug 13, 2017 1500 08-13-2017 2138  BP: 107/66 106/64  Pulse: 90 (!) 58  Resp: (!) 24 15  Temp: (!) 97.4 F (36.3 C) 97.6 F (36.4 C)  SpO2: 100% 90%      Physical Exam  Gen: unresponsive Cardiovascular: pulseless  Resp: apneic. Breath sounds equal bilaterally with bagging  Abd: nondistended  Neuro: GCS 3, unresponsive to pain  HEENT: No blood in posterior pharynx, gag reflex absent  Neck: No crepitus  Musculoskeletal: No deformity  Skin: warm  MDM Number of Diagnoses or Management Options Community acquired pneumonia of right lung, unspecified part of lung:  Dehydration:  Severe sepsis [facility]):  Critical Care Total time providing critical care: 30-74 minutes  MDM Reviewed: previous chart, nursing note and vitals Interpretation: labs and x-ray (hypernatremia renal failure) Total time providing critical care: 30-74 minutes. This excludes time spent performing separately reportable procedures and services.   CRITICAL CARE Performed by: Jasmine Awe Total critical care time: 30 minutes Critical care time was exclusive of separately billable procedures and treating other patients. Critical care was necessary to treat or prevent imminent or life-threatening deterioration. Critical care was time spent personally by me on the following activities: development of treatment plan with patient and/or surrogate as well as nursing, discussions with consultants, evaluation of patient's response to treatment, examination of patient, obtaining history from patient  or surrogate, ordering and performing treatments and interventions, ordering and review of laboratory studies, ordering and review of radiographic studies, pulse oximetry and re-evaluation of patient's condition.  Cardiopulmonary Resuscitation (CPR) Procedure Note  Directed/Performed by: Jasmine AwePALUMBO-RASCH,Myshawn Chiriboga K I personally directed ancillary staff and/or performed CPR in an effort to regain  return of spontaneous circulation and to maintain cardiac, neuro and systemic perfusion.    Medical Decision making  1. Hypernatremia 2. Purulent secretion, likely epyema 3. Purulent drainage in foley    Assessment and Plan  PEA arrest without ROSC following 4 epis and continuous CPR and intubation TOD 109 am    Ty Buntrock, MD 08/22/2017 0120

## 2017-08-26 NOTE — Transfer of Care (Signed)
Immediate Anesthesia Transfer of Care Note  Patient: Tim Foster  Procedure(s) Performed: AN AD HOC INTUBATION  Patient Location: 5 East  Anesthesia Type:Intubation on Unit  Level of Consciousness: unresponsive  Airway & Oxygen Therapy: NONE  Post-op Assessment: CPR Terminated   Post vital signs: unstable  Last Vitals:  Vitals:   Oct 05, 2017 1500 Oct 05, 2017 2138  BP: 107/66 106/64  Pulse: 90 (!) 58  Resp: (!) 24 15  Temp: (!) 36.3 C 36.4 C  SpO2: 100% 90%    Last Pain:  Vitals:   Oct 05, 2017 2138  TempSrc: Axillary         Complications: No apparent anesthesia complications

## 2017-08-26 NOTE — Progress Notes (Signed)
Asystole noted at 0050 by telemetry. Code Blue initiated without success.  MD/AC/family notified.

## 2017-08-26 NOTE — Discharge Summary (Signed)
Death Summary  Tim Foster Ashraf [id number] DOB: [birth year] DOA: 2017-07-24  PCP: Earline [facility]ottePccm, [facility], [place]  Admit date: 2017-07-24 Date of Death: 08/18/2017  Final Diagnoses:  Active Problems:   Essential hypertension   COPD with chronic bronchitis (HCC)   CAP (community acquired pneumonia)   ARF (acute renal failure) (HCC)   Acute hypernatremia      History of present illness:  Tim Foster Kilker is a 84 y.o. male with medical history significant of hypertension hyperlipidemia, COPD, chronic smoker, depression, Insomnia, BPH, lives at home, was brought in by family after a mechanical fall today. Pt is confused, could not provide any detailed history. There is no family at bedside.called his wife and got most of the history. As per the wife, pt has n't seen a physician in the last 5 years. He has not been eating or drinking, and mostly in bed, which has worsening the last few months. He is confused at baseline.  Chest x-ray shows right upper lobe consolidation, with possible right hilar mass.  [facility] Pt was admitted and around 1 am , CODE blue was called as pt became unresponsive, CPR was started , even after 4 epi and continuous CPR and intubation, pt couldn't be resuscitated and he passed at 1:09 am.       Time: 1:09 am   Signed:  Kathlen ModyVijaya Sebastian Dzik  [facility] 08/11/2017, 10:17 AM

## 2017-08-26 DEATH — deceased

## 2019-01-28 IMAGING — CT CT HEAD W/O CM
3 of 8 series · 12 of 47 positions shown, 14 images · non-contrast
Comparison: 03/02/2005 CT head.  11/06/2004 MRI head.

CLINICAL DATA: 83 y/o  M; mechanical fall today and confusion.

EXAM:
CT HEAD WITHOUT CONTRAST
TECHNIQUE: Contiguous axial images were obtained from the base of the skull
through the vertex without intravenous contrast.

[Series 6: coronal soft tissue · coronal · 0.32mm/px · 3 of 67 slices shown]
[im 17/67  brain]
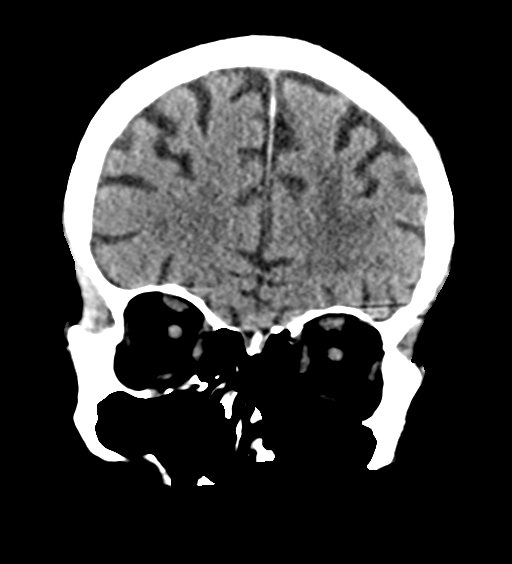
[im 34/67  brain]
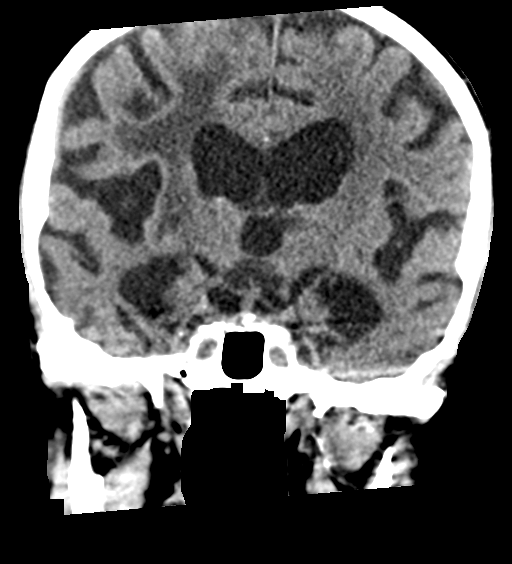
[im 50/67  brain]
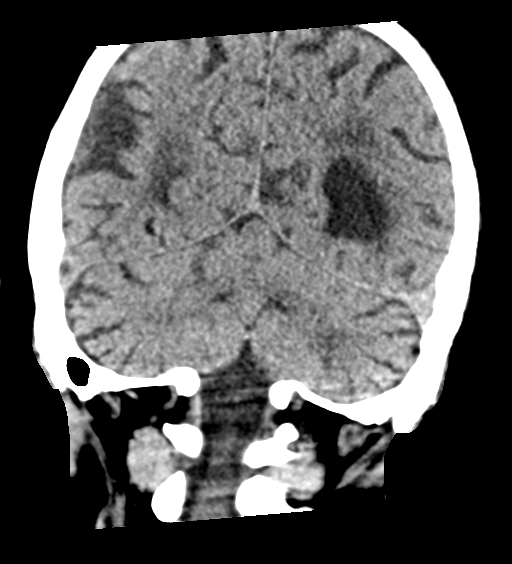

[Series 9: sagittal soft tissue · sagittal · 0.13mm/px · 2 of 49 slices shown]
[im 17/49  brain]
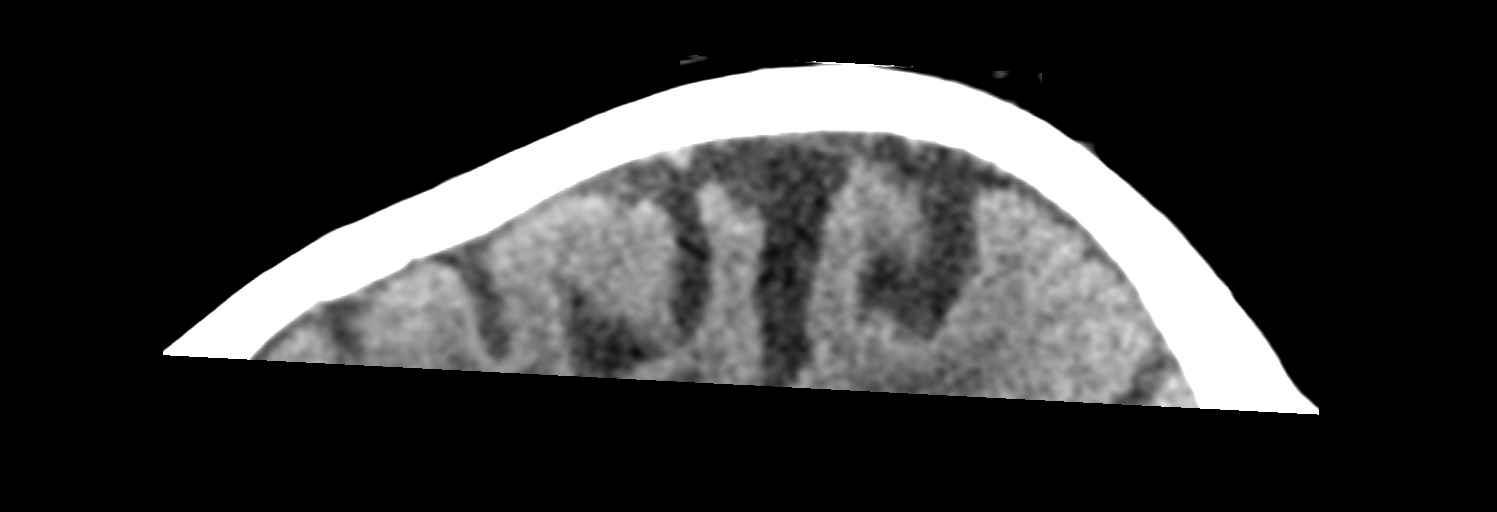
[im 33/49  brain]
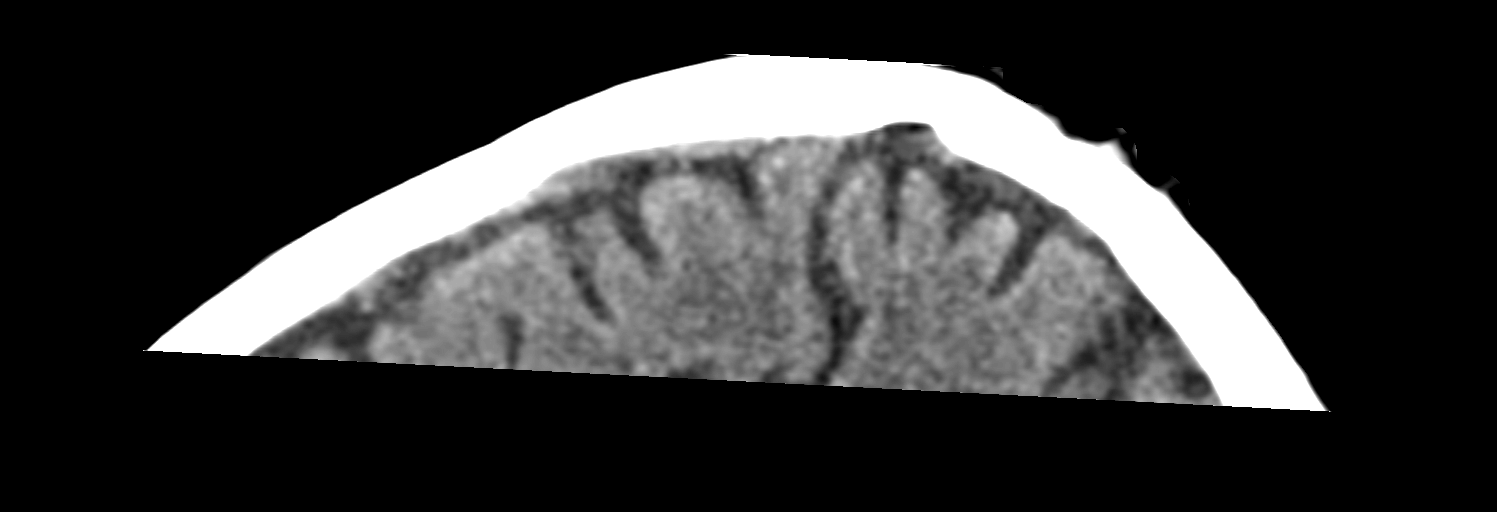

[Series 10: ax head wo · axial · 0.34mm/px · z∈[-193,-57]mm · 7 of 37 slices shown, 9 images]
[im 5/37  brain]
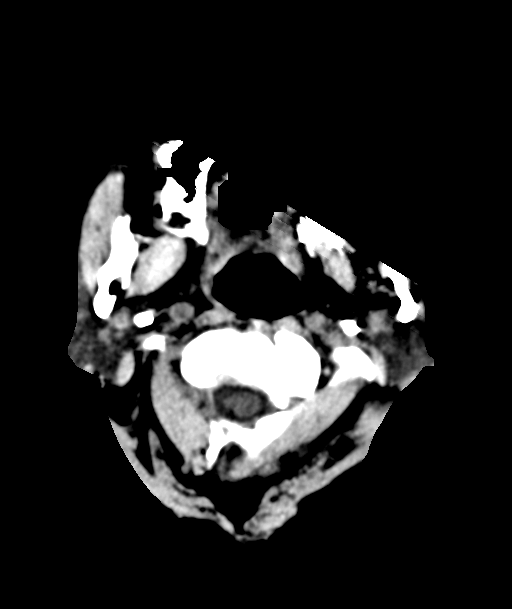
[im 5/37  bone]
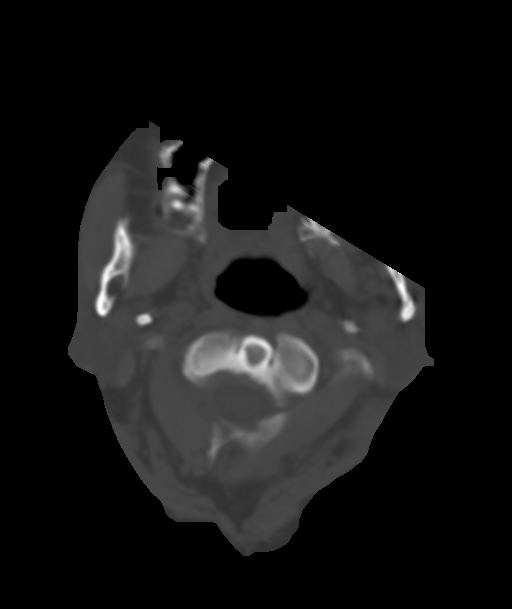
[im 9/37  brain]
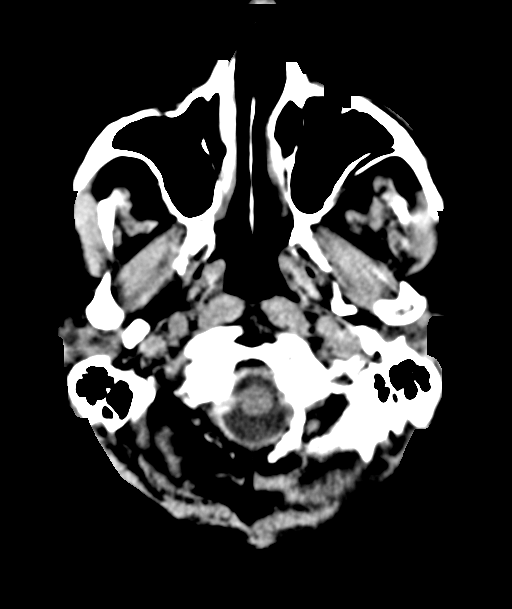
[im 13/37  brain]
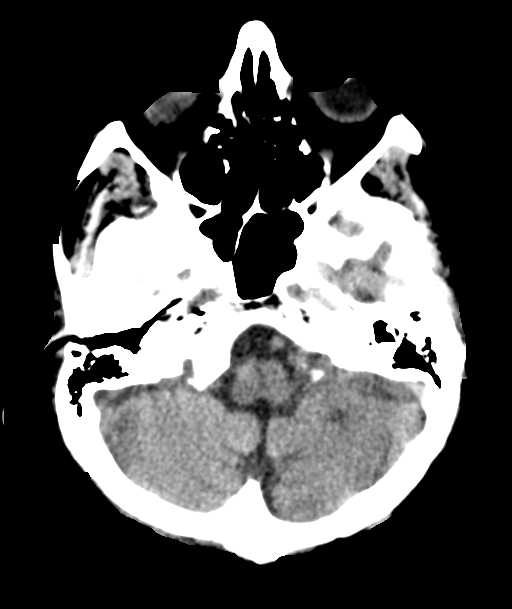
[im 21/37  brain]
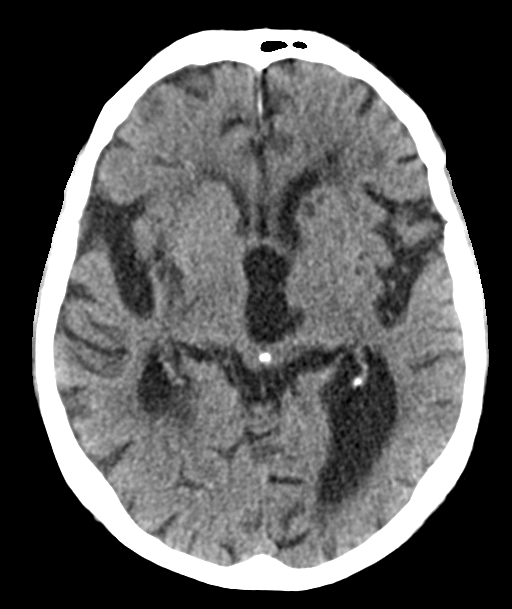
[im 25/37  brain]
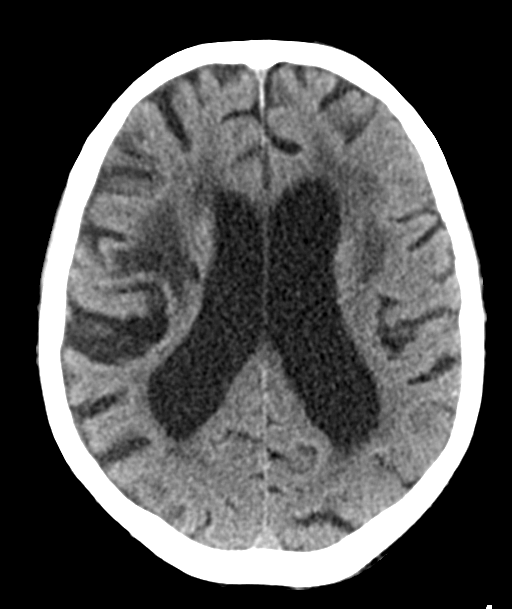
[im 25/37  bone]
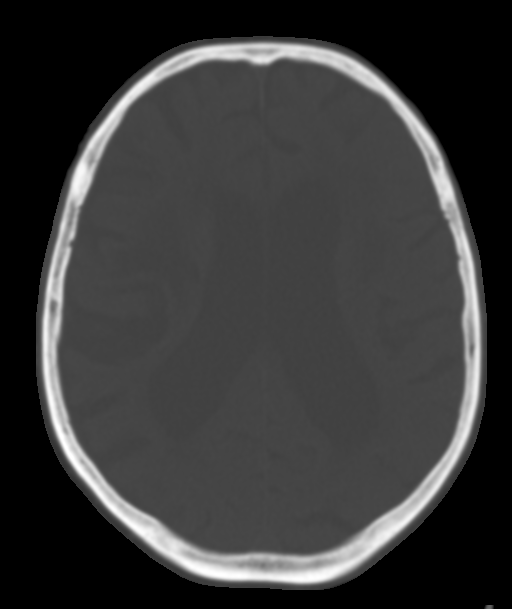
[im 29/37  brain]
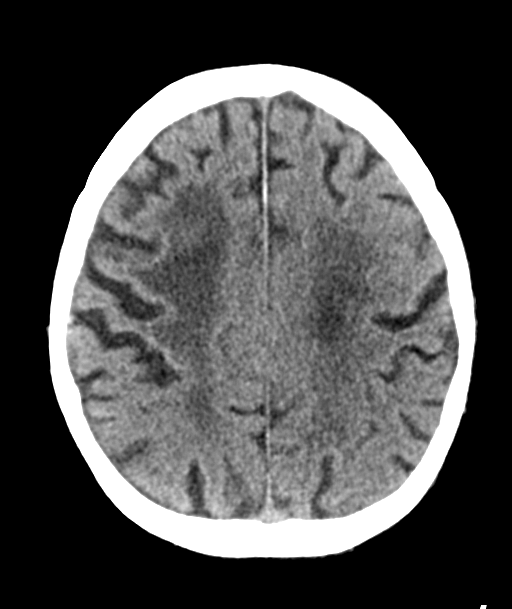
[im 33/37  brain]
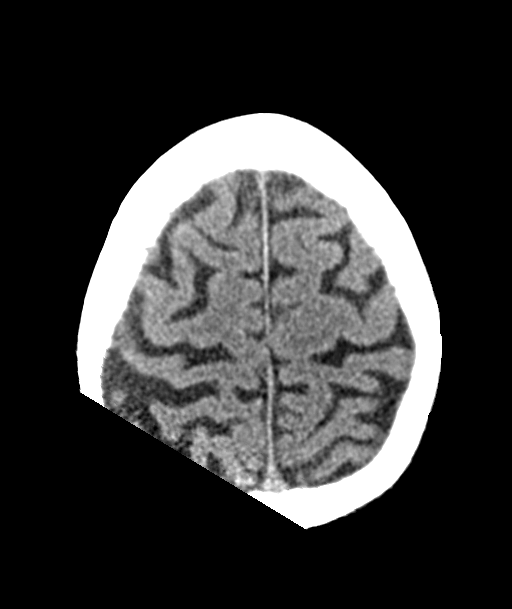

[12 of 47 positions shown; findings below may reference images not displayed]

FINDINGS: Brain: No evidence of acute infarction, hemorrhage, hydrocephalus,
extra-axial collection or mass lesion/mass effect. Chronic
infarction involving right insula, basal ganglia, and and frontal
lobe white matter. Several chronic lacunar infarcts within the basal
ganglia bilaterally, brainstem, and cerebellum. Advanced
microvascular ischemic changes and parenchymal volume loss of the
brain progressed from 1449.

Vascular: Calcific atherosclerosis of carotid siphons. No hyperdense
vessel identified.

Skull: Normal. Negative for fracture or focal lesion.

Sinuses/Orbits: Small mucous retention cyst in left maxillary sinus.
Normal aeration of mastoid air cells. Partial opacification of right
external auditory canal, likely cerumen. Orbits are unremarkable.

Other: None.
IMPRESSION: 1. No acute intracranial abnormality or calvarial fracture
identified.
2. Progression of microvascular ischemic changes, parenchymal volume
loss, and chronic infarction 1449.

By: Jhemboy Padam M.D.
